# Patient Record
Sex: Female | Born: 1960 | Race: White | Hispanic: No | Marital: Married | Smoking: Never smoker
Health system: Southern US, Community
[De-identification: ages and names within clinical notes are randomized; demographics above are authoritative.]

## PROBLEM LIST (undated history)

## (undated) DIAGNOSIS — E785 Hyperlipidemia, unspecified: Secondary | ICD-10-CM

## (undated) DIAGNOSIS — K219 Gastro-esophageal reflux disease without esophagitis: Secondary | ICD-10-CM

## (undated) DIAGNOSIS — I10 Essential (primary) hypertension: Secondary | ICD-10-CM

## (undated) HISTORY — PX: NO PAST SURGERIES: SHX2092

## (undated) HISTORY — PX: CRYOABLATION: SHX1415

## (undated) HISTORY — PX: ABDOMINAL HYSTERECTOMY: SHX81

---

## 1999-03-17 ENCOUNTER — Other Ambulatory Visit: Admission: RE | Admit: 1999-03-17 | Discharge: 1999-03-17 | Payer: Self-pay | Admitting: Obstetrics and Gynecology

## 2000-04-20 ENCOUNTER — Other Ambulatory Visit: Admission: RE | Admit: 2000-04-20 | Discharge: 2000-04-20 | Payer: Self-pay | Admitting: Obstetrics and Gynecology

## 2001-09-19 ENCOUNTER — Other Ambulatory Visit: Admission: RE | Admit: 2001-09-19 | Discharge: 2001-09-19 | Payer: Self-pay | Admitting: Obstetrics and Gynecology

## 2002-07-19 ENCOUNTER — Ambulatory Visit (HOSPITAL_COMMUNITY): Admission: RE | Admit: 2002-07-19 | Discharge: 2002-07-19 | Payer: Self-pay | Admitting: Obstetrics and Gynecology

## 2003-01-28 ENCOUNTER — Other Ambulatory Visit: Admission: RE | Admit: 2003-01-28 | Discharge: 2003-01-28 | Payer: Self-pay | Admitting: Obstetrics and Gynecology

## 2005-10-29 ENCOUNTER — Other Ambulatory Visit: Admission: RE | Admit: 2005-10-29 | Discharge: 2005-10-29 | Payer: Self-pay | Admitting: Obstetrics and Gynecology

## 2011-09-24 ENCOUNTER — Other Ambulatory Visit: Payer: Self-pay | Admitting: Family Medicine

## 2011-09-27 ENCOUNTER — Other Ambulatory Visit (HOSPITAL_COMMUNITY): Payer: Self-pay | Admitting: *Deleted

## 2011-09-27 MED ORDER — SODIUM CHLORIDE 0.9 % IV SOLN: INTRAVENOUS | Status: DC

## 2011-09-28 ENCOUNTER — Encounter (HOSPITAL_COMMUNITY): Payer: Self-pay

## 2011-09-28 ENCOUNTER — Other Ambulatory Visit (HOSPITAL_COMMUNITY): Payer: Self-pay | Admitting: *Deleted

## 2011-09-28 ENCOUNTER — Encounter (HOSPITAL_COMMUNITY)
Admission: RE | Admit: 2011-09-28 | Discharge: 2011-09-28 | Disposition: A | Discharge status: Home / Self Care | Payer: 59 | Source: Ambulatory Visit | Attending: Family Medicine | Admitting: Family Medicine

## 2011-09-28 DIAGNOSIS — D509 Iron deficiency anemia, unspecified: Secondary | ICD-10-CM | POA: Insufficient documentation

## 2011-09-28 HISTORY — DX: Hyperlipidemia, unspecified: E78.5

## 2011-09-28 HISTORY — DX: Gastro-esophageal reflux disease without esophagitis: K21.9

## 2011-09-28 HISTORY — DX: Essential (primary) hypertension: I10

## 2011-09-28 MED ORDER — SODIUM CHLORIDE 0.9 % IV SOLN
INTRAVENOUS | Status: DC
  Administered 2011-09-28: 10:00:00 via INTRAVENOUS

## 2011-09-28 MED ORDER — SODIUM CHLORIDE 0.9 % IV SOLN
1000.0000 mg | Freq: Once | INTRAVENOUS | Status: AC
  Administered 2011-09-28: 1000 mg via INTRAVENOUS
  Filled 2011-09-28: qty 20

## 2011-09-28 MED ORDER — SODIUM CHLORIDE 0.9 % IV SOLN
25.0000 mg | Freq: Once | INTRAVENOUS | Status: AC
  Administered 2011-09-28: 25 mg via INTRAVENOUS
  Filled 2011-09-28: qty 0.5

## 2011-09-28 NOTE — Progress Notes (Signed)
Patient tolerated test dose well. Pharmacy aware and will send full dose.

## 2012-03-01 ENCOUNTER — Other Ambulatory Visit: Payer: Self-pay | Admitting: Dermatology

## 2012-03-23 ENCOUNTER — Telehealth: Payer: Self-pay | Admitting: Hematology and Oncology

## 2012-03-23 NOTE — Telephone Encounter (Signed)
lmonvm adviisng the pt to call me back about an appt

## 2012-03-24 ENCOUNTER — Telehealth: Payer: Self-pay | Admitting: Hematology and Oncology

## 2012-03-24 NOTE — Telephone Encounter (Signed)
S/w the pt's dtr regarding trying to set up the pt for a new pt appt with dr odogwu. Per pt's dtr she would need to discuss this with the rmother first and have her call us back

## 2012-03-27 ENCOUNTER — Telehealth: Payer: Self-pay | Admitting: Hematology and Oncology

## 2012-03-27 NOTE — Telephone Encounter (Signed)
Pt called me back to set up her new pt appt with dr Dalene Carrow

## 2012-03-27 NOTE — Telephone Encounter (Signed)
Referred by Dr. Robert Reade Dx-IDA °

## 2012-04-04 ENCOUNTER — Other Ambulatory Visit: Payer: Self-pay | Admitting: Dermatology

## 2012-04-06 ENCOUNTER — Ambulatory Visit (HOSPITAL_BASED_OUTPATIENT_CLINIC_OR_DEPARTMENT_OTHER): Payer: 59 | Admitting: Hematology and Oncology

## 2012-04-06 ENCOUNTER — Ambulatory Visit (HOSPITAL_BASED_OUTPATIENT_CLINIC_OR_DEPARTMENT_OTHER): Payer: 59

## 2012-04-06 ENCOUNTER — Encounter: Payer: Self-pay | Admitting: Hematology and Oncology

## 2012-04-06 ENCOUNTER — Ambulatory Visit (HOSPITAL_BASED_OUTPATIENT_CLINIC_OR_DEPARTMENT_OTHER): Payer: 59 | Admitting: Lab

## 2012-04-06 ENCOUNTER — Telehealth: Payer: Self-pay | Admitting: Hematology and Oncology

## 2012-04-06 VITALS — BP 146/92 | HR 83 | Temp 99.5°F | Ht 67.0 in | Wt 132.8 lb

## 2012-04-06 DIAGNOSIS — N920 Excessive and frequent menstruation with regular cycle: Secondary | ICD-10-CM

## 2012-04-06 DIAGNOSIS — D509 Iron deficiency anemia, unspecified: Secondary | ICD-10-CM

## 2012-04-06 DIAGNOSIS — D539 Nutritional anemia, unspecified: Secondary | ICD-10-CM

## 2012-04-06 DIAGNOSIS — R5381 Other malaise: Secondary | ICD-10-CM

## 2012-04-06 DIAGNOSIS — R58 Hemorrhage, not elsewhere classified: Secondary | ICD-10-CM

## 2012-04-06 DIAGNOSIS — E119 Type 2 diabetes mellitus without complications: Secondary | ICD-10-CM | POA: Insufficient documentation

## 2012-04-06 DIAGNOSIS — I10 Essential (primary) hypertension: Secondary | ICD-10-CM | POA: Insufficient documentation

## 2012-04-06 LAB — CBC & DIFF AND RETIC
BASO%: 0.9 % (ref 0.0–2.0)
Eosinophils Absolute: 0.2 10*3/uL (ref 0.0–0.5)
MONO#: 0.5 10*3/uL (ref 0.1–0.9)
NEUT#: 4.5 10*3/uL (ref 1.5–6.5)
RBC: 4.53 10*6/uL (ref 3.70–5.45)
RDW: 12.8 % (ref 11.2–14.5)
Retic %: 1.19 % (ref 0.70–2.10)
Retic Ct Abs: 53.91 10*3/uL (ref 33.70–90.70)
WBC: 6.7 10*3/uL (ref 3.9–10.3)

## 2012-04-06 LAB — MORPHOLOGY: PLT EST: INCREASED

## 2012-04-06 NOTE — Progress Notes (Signed)
New patient today, patient accompanied by her husband, patient has insurance patient stated that she was not in need of any assistance and did not think that she would need the assistance, she did take my contact information.

## 2012-04-06 NOTE — Progress Notes (Signed)
Dr.     Elias Else      -      Primary  @  [facility]. Dr.     Venancio Poisson      -      Dermatologist. Dr.     Richardean Chimera      -     GYN.  [facility]    On   [address].  Cell    Phone       [phone].

## 2012-04-06 NOTE — Progress Notes (Signed)
CC:   Robert A. Nicholos Johns, M.D. Shirley Friar, MD Juluis Mire, M.D.  IDENTIFYING STATEMENT:  The patient is a 51 year old woman seen at the request of Dr. Nicholos Johns with anemia.  HISTORY OF PRESENT ILLNESS:  The patient has a history of ongoing anemia despite being on oral iron for a year and a half.  She received IV iron in the form of dextran in November of 2012.  She noted after this improved energy levels.  She was not craving ice.  She also notes that she had alopecia and her hair had begun to grow back.  Unfortunately her symptoms have returned which she describes as a vengeance.  She is not able to focus.  She has fatigue and gets tired easily.  She does have a background history of heavy menses.  She describes periods lasting as long as a week with heavy flow with clots.  She does see Dr. Arelia Sneddon. She is known to have fibroids.  She indicates a hysterectomy after she has previously failed medications.  The patient admits that she has been reluctant.  She also states she received a colonoscopy at age 1 with Dr. Bosie Clos.  She remarks that this was unremarkable.  She has not had an endoscopy or small bowel exam.  She denies rectal bleeding, hematuria.  She does describe that she is prone to bruising easily.  She has had some intentional weight loss because she notes increasing nausea with decreased appetite.  She is also now beginning to crave ice. Reviewed CBC obtained at Dr. Benjaman Pott office and results are as follows.  03/08/2012 white cell count 7.1, hemoglobin 12.8, hematocrit 9.2, platelets 409.  TIBC 488.  Iron 55.  Ferritin 5.6.  10/08/2011 white cell count 8.2, hemoglobin 9.9, hematocrit 32.7, platelets 425. 09/03/2011 white cell count 6.3, hemoglobin 9.1, hematocrit 29, platelets 420.  PAST MEDICAL HISTORY: 1. Diabetes mellitus. 2. Hypertension. 3. Hyperlipidemia. 4. GERD. 5. Attention-deficit disorder.  ALLERGIES:  Accupril.  MEDICATIONS:  Losartan  hydrochlorothiazide 100/12.5 one tablet daily, Lipitor 40 mg daily, omeprazole 20 mg daily, atomoxetine 80 mg daily, Norvasc 5 mg daily, Poly Iron 150 mg b.i.d., Biotin 500 mcg daily, saxagliptin metformin 2.5/100 daily.  SOCIAL HISTORY:  The patient is married with 2 children.  Denies alcohol or tobacco use.  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  FAMILY HISTORY:  Negative for hematologic or oncologic malignancies. Negative for anemia, bruising or bleeding.  HEALTH MAINTENANCE:  Gets annual mammograms.  Last colonoscopy as noted above was at age 51.  REVIEW OF SYSTEMS:  Denies fever, chills, night sweats, anorexia.  Has undocumented weight loss.  Notes alopecia.  She is fatigued.  Denies pain.  GI:  Admits to nausea with anorexia.  Denies abdominal pain, diarrhea, melena, hematochezia.  She is not constipated.  GU:  Denies dysuria, hematuria, nocturia, frequency.  GYN:  Admits to menorrhagia. Skin:  Bruises easily.  Cardiovascular:  Denies chest pain, PND, orthopnea, ankle swelling.  Respirations:  Denies cough, hemoptysis, wheeze, shortness of breath.  Neurologic:  Denies headaches, vision change, extremity weakness.  Rest of the review of systems negative.  PHYSICAL EXAM:  The patient is a well-appearing, well-nourished woman in no distress.  Vitals:  Pulse 83, blood pressure 146/92, temperature 99.5, respirations 18, weight 132 pounds.  HEENT:  Head is atraumatic, normocephalic.  Sclerae anicteric.  Mouth moist.  Neck:  Supple.  Chest: Clear to percussion and auscultation.  Cardiovascular:  First and second heart sounds present.  No added  sounds or murmurs.  Abdomen:  Soft, nontender.  Bowel sounds present.  Extremities:  No calf tenderness. Pulses present and symmetrical.  CNS:  Nonfocal.  Lymph nodes:  No adenopathy.  IMPRESSION AND PLAN:  Mrs. Ashmore is a 51 year old woman with a history of severe iron-deficiency anemia which is likely secondary to menorrhagia. She has  received a colonoscopy 2 years ago.  I would like for her to touch base with Dr. Bosie Clos to see if an endoscopy and small bowel exam is required.  Document very low iron stores on labs that she had earlier this month.  In the past her symptoms seemed to have improved with IV iron.  She is a candidate for IV iron, but in the form of Feraheme.  This will be scheduled soon.  She also gives a history of menorrhagia with bruising.  Dr. Arelia Sneddon has suggested a hysterectomy.  I recommend that she return and discuss this further with him as this may decrease symptoms of anemia in the future. I also think it would be a good idea for her to get a von Willebrand panel.  In addition, she will return to lab to obtain a repeat CBC with anemia panel.  Will also check a vitamin B12 level as she does take metformin.  Will also rule out myelodyscrasia with a serum protein electrophoresis IFE.  Will review morphology.  Will rule out hemolysis with Coombs and haptoglobin.  I spent more than half the time discussing anemia potential treatments and plan.  The patient who is here with her husband had a number of questions which were all answered.  She follows up in 6 weeks time with blood work.  Will also be telephoning the patient with results as we receive them.    ______________________________ Laurice Record, M.D. LIO/MEDQ  D:  04/06/2012  T:  04/06/2012  Job:  [id number]

## 2012-04-06 NOTE — Patient Instructions (Signed)
Chelsea Espinoza  [id number]  [facility] Discharge Instructions  RECOMMENDATIONS MADE BY THE CONSULTANT AND ANY TEST RESULTS WILL BE SENT TO YOUR REFERRING DOCTOR.   EXAM FINDINGS BY MD TODAY AND SIGNS AND SYMPTOMS TO REPORT TO CLINIC OR PRIMARY MD:   Your current list of medications are: Current Outpatient Prescriptions  Medication Sig Dispense Refill  . atomoxetine (STRATTERA) 80 MG capsule Take 80 mg by mouth daily.        Marland Kitchen atorvastatin (LIPITOR) 40 MG tablet Take 40 mg by mouth daily.        . Linagliptin-Metformin HCl (JENTADUETO) 2.03-999 MG TABS Take 1 tablet by mouth 2 (two) times daily. Jentadueto 2.03/999 : 2.5mg /1000mg  1 tablet twice / day with meals       . omeprazole (PRILOSEC) 20 MG capsule Take 20 mg by mouth daily.           INSTRUCTIONS GIVEN AND DISCUSSED:   SPECIAL INSTRUCTIONS/FOLLOW-UP:  See above.  I acknowledge that I have been informed and understand all the instructions given to me and received a copy. I do not have any more questions at this time, but understand that I may call the [facility] at [phone] during business hours should I have any further questions or need assistance in obtaining follow-up care.

## 2012-04-06 NOTE — Progress Notes (Signed)
This office note has been dictated.

## 2012-04-06 NOTE — Telephone Encounter (Signed)
apts made and printed for pt aom °

## 2012-04-07 ENCOUNTER — Telehealth: Payer: Self-pay | Admitting: Hematology and Oncology

## 2012-04-07 ENCOUNTER — Ambulatory Visit (HOSPITAL_BASED_OUTPATIENT_CLINIC_OR_DEPARTMENT_OTHER): Payer: 59

## 2012-04-07 VITALS — BP 132/82 | HR 78 | Temp 97.6°F

## 2012-04-07 DIAGNOSIS — D509 Iron deficiency anemia, unspecified: Secondary | ICD-10-CM

## 2012-04-07 DIAGNOSIS — D539 Nutritional anemia, unspecified: Secondary | ICD-10-CM

## 2012-04-07 MED ORDER — SODIUM CHLORIDE 0.9 % IV SOLN
1020.0000 mg | Freq: Once | INTRAVENOUS | Status: AC
  Administered 2012-04-07: 1020 mg via INTRAVENOUS
  Filled 2012-04-07: qty 34

## 2012-04-07 MED ORDER — SODIUM CHLORIDE 0.9 % IV SOLN
Freq: Once | INTRAVENOUS | Status: AC
  Administered 2012-04-07: 12:00:00 via INTRAVENOUS

## 2012-04-07 NOTE — Telephone Encounter (Signed)
5/30 pt was to see gi drschooler on 5/30 at 2:30

## 2012-04-07 NOTE — Patient Instructions (Addendum)
Ferumoxytol injection What is this medicine? FERUMOXYTOL is an iron complex. Iron is used to make healthy red blood cells, which carry oxygen and nutrients throughout the body. This medicine is used to treat iron deficiency anemia in people with chronic kidney disease. This medicine may be used for other purposes; ask your health care provider or pharmacist if you have questions. What should I tell my health care provider before I take this medicine? They need to know if you have any of these conditions: -anemia not caused by low iron levels -high levels of iron in the blood -magnetic resonance imaging (MRI) test scheduled -an unusual or allergic reaction to iron, other medicines, foods, dyes, or preservatives -pregnant or trying to get pregnant -breast-feeding How should I use this medicine? This medicine is for infusion into a vein. It is given by a health care professional in a hospital or clinic setting. Talk to your pediatrician regarding the use of this medicine in children. Special care may be needed. Overdosage: If you think you've taken too much of this medicine contact a poison control center or emergency room at once. Overdosage: If you think you have taken too much of this medicine contact a poison control center or emergency room at once. NOTE: This medicine is only for you. Do not share this medicine with others. What if I miss a dose? It is important not to miss your dose. Call your doctor or health care professional if you are unable to keep an appointment. What may interact with this medicine? This medicine may interact with the following medications: -other iron products This list may not describe all possible interactions. Give your health care provider a list of all the medicines, herbs, non-prescription drugs, or dietary supplements you use. Also tell them if you smoke, drink alcohol, or use illegal drugs. Some items may interact with your medicine. What should I watch  for while using this medicine? Visit your doctor or healthcare professional regularly. Tell your doctor or healthcare professional if your symptoms do not start to get better or if they get worse. You may need blood work done while you are taking this medicine. You may need to follow a special diet. Talk to your doctor. Foods that contain iron include: whole grains/cereals, dried fruits, beans, or peas, leafy green vegetables, and organ meats (liver, kidney). What side effects may I notice from receiving this medicine? Side effects that you should report to your doctor or health care professional as soon as possible: -allergic reactions like skin rash, itching or hives, swelling of the face, lips, or tongue -breathing problems -changes in blood pressure -feeling faint or lightheaded, falls -fever or chills -flushing, sweating, or hot feelings -swelling of the ankles or feet Side effects that usually do not require medical attention (Report these to your doctor or health care professional if they continue or are bothersome.): -diarrhea -headache -nausea, vomiting -stomach pain This list may not describe all possible side effects. Call your doctor for medical advice about side effects. You may report side effects to FDA at 1-800-FDA-1088. Where should I keep my medicine? This drug is given in a hospital or clinic and will not be stored at home. NOTE: This sheet is a summary. It may not cover all possible information. If you have questions about this medicine, talk to your doctor, pharmacist, or health care provider.  2012, Elsevier/Gold Standard. (07/17/2008 9:48:25 PM) 

## 2012-04-07 NOTE — Progress Notes (Signed)
1240- Feraheme infusion complete.  Pt tolerated infusion well, no complaints-dhp, rn

## 2012-04-10 ENCOUNTER — Telehealth: Payer: Self-pay | Admitting: *Deleted

## 2012-04-10 LAB — SPEP & IFE WITH QIG
Alpha-2-Globulin: 9.8 % (ref 7.1–11.8)
IgA: 139 mg/dL (ref 69–380)
IgG (Immunoglobin G), Serum: 1020 mg/dL (ref 690–1700)
IgM, Serum: 145 mg/dL (ref 52–322)
Total Protein, Serum Electrophoresis: 7.2 g/dL (ref 6.0–8.3)

## 2012-04-10 LAB — COMPREHENSIVE METABOLIC PANEL
ALT: 11 U/L (ref 0–35)
BUN: 15 mg/dL (ref 6–23)
CO2: 25 mEq/L (ref 19–32)
Calcium: 10.1 mg/dL (ref 8.4–10.5)
Chloride: 103 mEq/L (ref 96–112)
Creatinine, Ser: 0.95 mg/dL (ref 0.50–1.10)

## 2012-04-10 LAB — LACTATE DEHYDROGENASE: LDH: 134 U/L (ref 94–250)

## 2012-04-10 LAB — VITAMIN B12: Vitamin B-12: 439 pg/mL (ref 211–911)

## 2012-04-10 LAB — IRON AND TIBC: UIBC: 439 ug/dL — ABNORMAL HIGH (ref 125–400)

## 2012-04-10 LAB — DIRECT ANTIGLOBULIN TEST (NOT AT ARMC): DAT IgG: NEGATIVE

## 2012-04-10 NOTE — Telephone Encounter (Signed)
Spoke with pt on cell phone and informed pt re: Ferritin level 4  As per md.    Asked how pt felt today post Feraheme infusion on 04/07/12.   Pt stated she was feeling fine ,  Not feeling any difference yet.   Pt understood that it would take at least a month to see the results of iron infusion.    Confirmed date and time for appts lab and f/u visit.   Pt voiced understanding.

## 2012-04-11 ENCOUNTER — Telehealth: Payer: Self-pay | Admitting: *Deleted

## 2012-04-11 NOTE — Telephone Encounter (Signed)
Spoke with pt and informed pt re:  VWB ok as per md.   Pt voiced understanding.

## 2012-04-12 ENCOUNTER — Ambulatory Visit: Payer: 59 | Admitting: Hematology and Oncology

## 2012-05-03 ENCOUNTER — Other Ambulatory Visit: Payer: Self-pay | Admitting: Gastroenterology

## 2012-05-03 ENCOUNTER — Other Ambulatory Visit (HOSPITAL_COMMUNITY)
Admission: RE | Admit: 2012-05-03 | Discharge: 2012-05-03 | Disposition: A | Discharge status: Home / Self Care | Payer: 59 | Source: Ambulatory Visit | Attending: Gastroenterology | Admitting: Gastroenterology

## 2012-05-03 DIAGNOSIS — D509 Iron deficiency anemia, unspecified: Secondary | ICD-10-CM | POA: Insufficient documentation

## 2012-05-05 ENCOUNTER — Other Ambulatory Visit (HOSPITAL_BASED_OUTPATIENT_CLINIC_OR_DEPARTMENT_OTHER): Payer: 59

## 2012-05-05 DIAGNOSIS — R58 Hemorrhage, not elsewhere classified: Secondary | ICD-10-CM

## 2012-05-05 DIAGNOSIS — D539 Nutritional anemia, unspecified: Secondary | ICD-10-CM

## 2012-05-05 DIAGNOSIS — N920 Excessive and frequent menstruation with regular cycle: Secondary | ICD-10-CM

## 2012-05-05 LAB — BASIC METABOLIC PANEL
BUN: 7 mg/dL (ref 6–23)
CO2: 20 mEq/L (ref 19–32)
Chloride: 103 mEq/L (ref 96–112)
Creatinine, Ser: 0.84 mg/dL (ref 0.50–1.10)
Glucose, Bld: 217 mg/dL — ABNORMAL HIGH (ref 70–99)
Potassium: 3.7 mEq/L (ref 3.5–5.3)

## 2012-05-05 LAB — CBC WITH DIFFERENTIAL/PLATELET
Basophils Absolute: 0 10*3/uL (ref 0.0–0.1)
EOS%: 2.9 % (ref 0.0–7.0)
Eosinophils Absolute: 0.2 10*3/uL (ref 0.0–0.5)
HCT: 37.7 % (ref 34.8–46.6)
HGB: 12.3 g/dL (ref 11.6–15.9)
MCH: 29 pg (ref 25.1–34.0)
NEUT#: 3.7 10*3/uL (ref 1.5–6.5)
NEUT%: 71.3 % (ref 38.4–76.8)
RDW: 15.8 % — ABNORMAL HIGH (ref 11.2–14.5)
lymph#: 0.9 10*3/uL (ref 0.9–3.3)

## 2012-05-05 LAB — IRON AND TIBC
%SAT: 24 % (ref 20–55)
Iron: 70 ug/dL (ref 42–145)

## 2012-05-05 LAB — FOLATE: Folate: 18.6 ng/mL

## 2012-05-05 LAB — FERRITIN: Ferritin: 106 ng/mL (ref 10–291)

## 2012-05-08 ENCOUNTER — Telehealth: Payer: Self-pay | Admitting: *Deleted

## 2012-05-08 NOTE — Telephone Encounter (Signed)
Received call from pt stating re: pt had EGD and colonoscopy done last Wed 05/03/12 by Dr. Bosie Clos.  Pt was informed by Dr. Bosie Clos that biopsy results might not be back in time for pt's appt with Dr. Dalene Carrow on [facility] 05/10/12.   Pt just wanted to let Dr. Dalene Carrow know.   Pt stated she would keep her f/u appt as scheduled unless md wanted to wait for biopsy reports. Pt's   Phone    [phone].

## 2012-05-09 ENCOUNTER — Telehealth: Payer: Self-pay | Admitting: *Deleted

## 2012-05-09 NOTE — Telephone Encounter (Signed)
Spoke with pt today.  Informed pt that Dr. Dalene Carrow does not need biopsy results since pt is evaluated here for anemia issue.  Pt voiced understanding and stated she would be coming for her appt on 05/10/12.

## 2012-05-10 ENCOUNTER — Telehealth: Payer: Self-pay | Admitting: Hematology and Oncology

## 2012-05-10 ENCOUNTER — Encounter: Payer: Self-pay | Admitting: Hematology and Oncology

## 2012-05-10 ENCOUNTER — Ambulatory Visit (HOSPITAL_BASED_OUTPATIENT_CLINIC_OR_DEPARTMENT_OTHER): Payer: 59 | Admitting: Hematology and Oncology

## 2012-05-10 ENCOUNTER — Telehealth: Payer: Self-pay | Admitting: *Deleted

## 2012-05-10 VITALS — BP 140/82 | HR 94 | Temp 98.9°F | Ht 67.0 in | Wt 132.7 lb

## 2012-05-10 DIAGNOSIS — D509 Iron deficiency anemia, unspecified: Secondary | ICD-10-CM

## 2012-05-10 DIAGNOSIS — D539 Nutritional anemia, unspecified: Secondary | ICD-10-CM

## 2012-05-10 DIAGNOSIS — N920 Excessive and frequent menstruation with regular cycle: Secondary | ICD-10-CM

## 2012-05-10 NOTE — Telephone Encounter (Signed)
appts made and printed for pt aom °

## 2012-05-10 NOTE — Progress Notes (Signed)
This office note has been dictated.

## 2012-05-10 NOTE — Telephone Encounter (Signed)
Per staff message I have scheduled appts. JMW  

## 2012-05-10 NOTE — Progress Notes (Signed)
CC:   Robert A. Nicholos Johns, M.D. Shirley Friar, MD Juluis Mire, M.D.  IDENTIFYING STATEMENT:  The patient is a 51 year old woman with anemia who presents for followup.  INTERVAL HISTORY:  In summary, the patient has a long history for menorrhagia.  Despite oral iron, has not been able to maintain iron stores.  She was referred to Hematology.  Ferritin levels drawn on 05/30 were 4.  Iron was 36, TIBC 475.  B12 439.  LDH was normal at 134, haptoglobin normal at 110.  Serum protein electrophoresis did not reveal an M spike, and immunoglobulin levels were unremarkable.  Von Willebrand's antigen and activity levels were unremarkable.  Direct Coombs was negative.  The patient received IV iron in the form of Feraheme on 05/30.  Does not note improvement in energy levels.  Has since seen Dr. Bosie Clos and has undergone a GI evaluation.  She has also had labs repeated, and they are below.  MEDICATIONS:  Reviewed and updated.  PAST MEDICAL HISTORY/FAMILY HISTORY/SOCIAL HISTORY:  Unchanged.  REVIEW OF SYSTEMS:  Besides generalized lack of energy and irregular heavy menses, rest of review of systems negative.  PHYSICAL EXAM:  Patient is alert and oriented x3.  Vitals:  Pulse 94, blood pressure 140/82, temperature 98.9, respirations 20, weight 132 pounds.  Sclerae anicteric.  Mouth moist.  Chest:  Clear.  Abdomen: Soft.  Extremities:  No edema.  LAB DATA:  05/05/2012 white cell count 5.1, hemoglobin 12.3, hematocrit 37.7, platelets 365.  Peripheral smear unremarkable.  Iron 70 (36), TIBC 286, saturation 24% (8%), ferritin 106 (4).  Sodium 138, potassium 3.7, chloride 103, CO2 20, BUN 7, creatinine 0.84, glucose 217, calcium 9.3.  IMPRESSION AND PLAN:  Mrs. Meenan is a 51 year old woman with a history of severe iron deficiency anemia likely secondary to menorrhagia.  I continue to recommend that she touch base with Dr. Arelia Sneddon as her anemia may never improve if she continues to have heavy  periods.  She concurs and will hopefully make an appointment soon.  Her ferritin stores are currently adequate, but I will have her return in a month's time.  Will repeat labs then.  If they continue to decline, will offer her Feraheme.    ______________________________ Laurice Record, M.D. LIO/MEDQ  D:  05/10/2012  T:  05/10/2012  Job:  [id number]

## 2012-05-10 NOTE — Patient Instructions (Signed)
Chelsea Espinoza  [id number]  [facility] Discharge Instructions  RECOMMENDATIONS MADE BY THE CONSULTANT AND ANY TEST RESULTS WILL BE SENT TO YOUR REFERRING DOCTOR.   EXAM FINDINGS BY MD TODAY AND SIGNS AND SYMPTOMS TO REPORT TO CLINIC OR PRIMARY MD:   Your current list of medications are: Current Outpatient Prescriptions  Medication Sig Dispense Refill  . amLODipine (NORVASC) 5 MG tablet Take 5 mg by mouth daily.      Marland Kitchen atomoxetine (STRATTERA) 80 MG capsule Take 80 mg by mouth daily.        Marland Kitchen atorvastatin (LIPITOR) 40 MG tablet Take 40 mg by mouth daily.        . Biotin 5000 MCG CAPS Take 1 capsule by mouth daily.      Marland Kitchen losartan-hydrochlorothiazide (HYZAAR) 100-12.5 MG per tablet Take 1 tablet by mouth daily.      Marland Kitchen omeprazole (PRILOSEC) 20 MG capsule Take 20 mg by mouth daily.        . Polysaccharide Iron Complex (POLY-IRON 150 PO) Take 1 tablet by mouth daily.       . Saxagliptin-Metformin 2.03-999 MG TB24 Take 2 tablets by mouth daily.         INSTRUCTIONS GIVEN AND DISCUSSED:   SPECIAL INSTRUCTIONS/FOLLOW-UP:  See above.  I acknowledge that I have been informed and understand all the instructions given to me and received a copy. I do not have any more questions at this time, but understand that I may call the [facility] at [phone] during business hours should I have any further questions or need assistance in obtaining follow-up care.

## 2012-05-12 ENCOUNTER — Other Ambulatory Visit: Payer: Self-pay | Admitting: Hematology and Oncology

## 2012-05-15 ENCOUNTER — Telehealth: Payer: Self-pay | Admitting: Hematology and Oncology

## 2012-05-15 NOTE — Telephone Encounter (Signed)
called pt with 8/1 iron tx   aom

## 2012-05-25 ENCOUNTER — Telehealth: Payer: Self-pay | Admitting: Hematology and Oncology

## 2012-05-25 NOTE — Telephone Encounter (Signed)
On 7/3 when pt was here I was able to get her in same day with Dr Chelsea Espinoza as she is estab there.

## 2012-06-06 ENCOUNTER — Other Ambulatory Visit (HOSPITAL_BASED_OUTPATIENT_CLINIC_OR_DEPARTMENT_OTHER): Payer: 59 | Admitting: Lab

## 2012-06-06 DIAGNOSIS — D509 Iron deficiency anemia, unspecified: Secondary | ICD-10-CM

## 2012-06-06 DIAGNOSIS — D539 Nutritional anemia, unspecified: Secondary | ICD-10-CM

## 2012-06-06 LAB — IRON AND TIBC
Iron: 140 ug/dL (ref 42–145)
TIBC: 352 ug/dL (ref 250–470)
UIBC: 212 ug/dL (ref 125–400)

## 2012-06-06 LAB — FERRITIN: Ferritin: 31 ng/mL (ref 10–291)

## 2012-06-06 LAB — CBC WITH DIFFERENTIAL/PLATELET
BASO%: 0.8 % (ref 0.0–2.0)
EOS%: 4.1 % (ref 0.0–7.0)
HCT: 43.2 % (ref 34.8–46.6)
MCH: 30.5 pg (ref 25.1–34.0)
MCHC: 33.4 g/dL (ref 31.5–36.0)
MONO#: 0.4 10*3/uL (ref 0.1–0.9)
RBC: 4.73 10*6/uL (ref 3.70–5.45)
RDW: 16.6 % — ABNORMAL HIGH (ref 11.2–14.5)
WBC: 5.2 10*3/uL (ref 3.9–10.3)
lymph#: 0.9 10*3/uL (ref 0.9–3.3)

## 2012-06-06 LAB — BASIC METABOLIC PANEL
CO2: 24 mEq/L (ref 19–32)
Chloride: 100 mEq/L (ref 96–112)
Potassium: 4 mEq/L (ref 3.5–5.3)
Sodium: 139 mEq/L (ref 135–145)

## 2012-06-08 ENCOUNTER — Telehealth: Payer: Self-pay | Admitting: Nurse Practitioner

## 2012-06-08 ENCOUNTER — Other Ambulatory Visit: Payer: Self-pay | Admitting: Family

## 2012-06-08 ENCOUNTER — Ambulatory Visit (HOSPITAL_BASED_OUTPATIENT_CLINIC_OR_DEPARTMENT_OTHER): Payer: 59

## 2012-06-08 ENCOUNTER — Other Ambulatory Visit: Payer: Self-pay | Admitting: Hematology and Oncology

## 2012-06-08 VITALS — BP 123/81 | HR 89 | Temp 98.0°F

## 2012-06-08 DIAGNOSIS — D539 Nutritional anemia, unspecified: Secondary | ICD-10-CM

## 2012-06-08 DIAGNOSIS — D509 Iron deficiency anemia, unspecified: Secondary | ICD-10-CM

## 2012-06-08 MED ORDER — SODIUM CHLORIDE 0.9 % IV SOLN: Freq: Once | INTRAVENOUS | Status: DC

## 2012-06-08 MED ORDER — SODIUM CHLORIDE 0.9 % IJ SOLN
3.0000 mL | Freq: Once | INTRAMUSCULAR | Status: DC | PRN
  Filled 2012-06-08: qty 10

## 2012-06-08 MED ORDER — SODIUM CHLORIDE 0.9 % IV SOLN
1020.0000 mg | Freq: Once | INTRAVENOUS | Status: AC
  Administered 2012-06-08: 1020 mg via INTRAVENOUS
  Filled 2012-06-08: qty 34

## 2012-06-08 NOTE — Telephone Encounter (Signed)
Spoke with patient- instructed per Dr. Dalene Carrow to come in for [facility] infusion this afternoon.  Pt also reported she had seen Dr. Arelia Sneddon and he had prescribed her Lupron to stop her periods.

## 2012-06-12 ENCOUNTER — Telehealth: Payer: Self-pay | Admitting: Hematology and Oncology

## 2012-06-12 NOTE — Telephone Encounter (Signed)
S/w pt re appts for 11/20 and 11/27. Per 8/1 pof appts should be in November. Pt instructed to disregard schedule she was given for September.

## 2012-07-28 ENCOUNTER — Other Ambulatory Visit: Payer: 59 | Admitting: Lab

## 2012-08-04 ENCOUNTER — Ambulatory Visit: Payer: 59 | Admitting: Hematology and Oncology

## 2012-08-04 ENCOUNTER — Ambulatory Visit: Payer: 59

## 2012-09-19 ENCOUNTER — Other Ambulatory Visit: Payer: Self-pay

## 2012-09-20 ENCOUNTER — Telehealth: Payer: Self-pay | Admitting: Hematology and Oncology

## 2012-09-20 NOTE — Telephone Encounter (Signed)
Moved f/u and iron inf from 11/27 to 11/29 - poof. S/w pt today re change w/new d/t. Also confirmed 11/20 lb appt

## 2012-09-27 ENCOUNTER — Other Ambulatory Visit (HOSPITAL_BASED_OUTPATIENT_CLINIC_OR_DEPARTMENT_OTHER): Payer: 59 | Admitting: Lab

## 2012-09-27 DIAGNOSIS — D539 Nutritional anemia, unspecified: Secondary | ICD-10-CM

## 2012-09-27 LAB — CBC WITH DIFFERENTIAL/PLATELET
Basophils Absolute: 0 10*3/uL (ref 0.0–0.1)
EOS%: 3.8 % (ref 0.0–7.0)
Eosinophils Absolute: 0.2 10*3/uL (ref 0.0–0.5)
HCT: 42.8 % (ref 34.8–46.6)
HGB: 14.5 g/dL (ref 11.6–15.9)
LYMPH%: 21.6 % (ref 14.0–49.7)
MCH: 30.3 pg (ref 25.1–34.0)
MCV: 89.5 fL (ref 79.5–101.0)
MONO%: 7.5 % (ref 0.0–14.0)
NEUT#: 3.4 10*3/uL (ref 1.5–6.5)
NEUT%: 66.3 % (ref 38.4–76.8)
Platelets: 361 10*3/uL (ref 145–400)
RDW: 12.2 % (ref 11.2–14.5)

## 2012-09-29 LAB — FERRITIN: Ferritin: 136 ng/mL (ref 10–291)

## 2012-09-29 LAB — IRON AND TIBC: %SAT: 32 % (ref 20–55)

## 2012-10-04 ENCOUNTER — Ambulatory Visit: Payer: 59 | Admitting: Hematology and Oncology

## 2012-10-04 ENCOUNTER — Ambulatory Visit: Payer: 59

## 2012-10-06 ENCOUNTER — Ambulatory Visit: Payer: 59

## 2012-10-06 ENCOUNTER — Telehealth: Payer: Self-pay | Admitting: *Deleted

## 2012-10-06 ENCOUNTER — Telehealth: Payer: Self-pay | Admitting: Hematology and Oncology

## 2012-10-06 ENCOUNTER — Encounter: Payer: Self-pay | Admitting: Hematology and Oncology

## 2012-10-06 ENCOUNTER — Ambulatory Visit (HOSPITAL_BASED_OUTPATIENT_CLINIC_OR_DEPARTMENT_OTHER): Payer: 59 | Admitting: Hematology and Oncology

## 2012-10-06 VITALS — BP 153/88 | HR 83 | Temp 97.2°F | Resp 18 | Ht 67.0 in | Wt 129.2 lb

## 2012-10-06 DIAGNOSIS — D219 Benign neoplasm of connective and other soft tissue, unspecified: Secondary | ICD-10-CM

## 2012-10-06 DIAGNOSIS — D509 Iron deficiency anemia, unspecified: Secondary | ICD-10-CM

## 2012-10-06 DIAGNOSIS — N920 Excessive and frequent menstruation with regular cycle: Secondary | ICD-10-CM

## 2012-10-06 NOTE — Progress Notes (Signed)
CC:   Robert A. Nicholos Johns, M.D. Juluis Mire, M.D.  IDENTIFYING STATEMENT:  The patient is a 51 year old woman with anemia who presents for followup.  INTERVAL HISTORY:  Mrs. Mcauliff was last seen in July.  She was initially diagnosed with anemia.  She was found to have low iron stores as a result of menorrhagia.  She has received 2 iron treatments in the form of Feraheme on 04/07/2012 and 06/08/2012.  She also reports that she has uterine fibroids and received Lupron shots.  She continues to have periods, albeit not as heavy as in the past.  She is also on oral prescription iron.  Otherwise feels well.  Energy level is fair.  She is able to perform all activities of daily living.  On 09/27/2012 white cell count 5.1, hemoglobin 14.5, hematocrit 42.8, platelets 361, iron 101, TIBC 320, saturation 32%, ferritin 136, folate 22.  MEDICATIONS:  NuIron 1 daily.  Rest of medicines reviewed and updated.  PHYSICAL EXAMINATION:  General:  The patient is alert and oriented times 3.  Vitals:  Pulse 83, blood pressure 153/88, temperature 97.2, respirations 18, weight 129.2 pounds.  HEENT:  Head is atraumatic, normocephalic.  Sclerae anicteric.  Mouth moist.  Chest/CVS: Unremarkable.  Abdomen:  Soft.  No masses.  Bowel sounds present. Extremities:  No edema.  LABORATORY DATA:  As above.  IMPRESSION AND PLAN:  Mrs. Worm is a 51 year old woman with a history of iron deficiency anemia which is likely felt to be secondary to menorrhagia.  Her current iron stores are adequate.  So with this said she will not be receiving Feraheme with this visit.  She follows up in 4 months' time for continuing assessment.    ______________________________ Laurice Record, M.D. LIO/MEDQ  D:  10/06/2012  T:  10/06/2012  Job:  [id number]

## 2012-10-06 NOTE — Telephone Encounter (Signed)
appts made and printed for pt aom °

## 2012-10-06 NOTE — Patient Instructions (Addendum)
Chelsea Espinoza  [id number]   [facility] - AFTER VISIT SUMMARY   **RECOMMENDATIONS MADE BY THE CONSULTANT AND ANY TEST    RESULTS WILL BE SENT TO YOUR REFERRING DOCTORS.   YOUR EXAM FINDINGS, LABS AND RESULTS WERE DISCUSSED BY YOUR MD TODAY.  YOU CAN GO TO THE [place] WEB SITE FOR INSTRUCTIONS ON HOW TO ASSESS MY CHART FOR ADDITIONAL INFORMATION AS NEEDED.  Your Updated drug allergies are: Allergies as of 10/06/2012 - Review Complete 10/06/2012  Allergen Reaction Noted  . Accupril (quinapril hcl)  04/06/2012    Your current list of medications are: Current Outpatient Prescriptions  Medication Sig Dispense Refill  . amLODipine (NORVASC) 5 MG tablet Take 5 mg by mouth daily.      Marland Kitchen atomoxetine (STRATTERA) 80 MG capsule Take 80 mg by mouth daily.        Marland Kitchen atorvastatin (LIPITOR) 40 MG tablet Take 40 mg by mouth daily.        . Biotin 5000 MCG CAPS Take 1 capsule by mouth daily.      Marland Kitchen losartan-hydrochlorothiazide (HYZAAR) 100-12.5 MG per tablet Take 1 tablet by mouth daily.      Marland Kitchen omeprazole (PRILOSEC) 20 MG capsule Take 20 mg by mouth daily.        . Polysaccharide Iron Complex (POLY-IRON 150 PO) Take 1 tablet by mouth daily.       . Saxagliptin-Metformin 2.03-999 MG TB24 Take 2 tablets by mouth daily.         INSTRUCTIONS GIVEN AND DISCUSSED:  See attached schedule   SPECIAL INSTRUCTIONS/FOLLOW-UP:  See above.  I acknowledge that I have been informed and understand all the instructions given to me and received a copy.I know to contact the clinic, my physician, or go to the emergency Department if any problems should occur.   I do not have any more questions at this time, but understand that I may call the [facility] at [phone] during business hours should I have any further questions or need assistance in obtaining follow-up care.

## 2012-10-06 NOTE — Telephone Encounter (Signed)
Faxed lab results done 09/27/12 to Dr. Shela Commons. McComb's office as per md's instructions.

## 2012-10-06 NOTE — Progress Notes (Signed)
This office note has been dictated.

## 2012-10-06 NOTE — Telephone Encounter (Signed)
Per staff phone call and POF I have scheduled appt. JMW  

## 2012-10-30 ENCOUNTER — Other Ambulatory Visit: Payer: 59

## 2012-11-06 ENCOUNTER — Ambulatory Visit: Payer: 59 | Admitting: Oncology

## 2012-12-09 ENCOUNTER — Telehealth: Payer: Self-pay | Admitting: *Deleted

## 2012-12-09 NOTE — Telephone Encounter (Signed)
Per patient reassignment, I have attempted to contact the patient regarding her new appts. I left the patient a message to contact me day 2/1 until 12pm at [phone] or on Monday at [phone]. Appts with lab/MD canceled. JMW

## 2012-12-09 NOTE — Telephone Encounter (Signed)
Another entry----Appts to be made with Dr. Arbutus Ped.

## 2012-12-11 ENCOUNTER — Telehealth: Payer: Self-pay | Admitting: *Deleted

## 2012-12-11 ENCOUNTER — Encounter: Payer: Self-pay | Admitting: *Deleted

## 2012-12-11 NOTE — Telephone Encounter (Signed)
Per patient reassignment, patient transferred from Dr. Dalene Carrow to Dr.Mohamed. I have explained to the patient that Dr. Dalene Carrow is no longer with the practice. New appts given. Letter mailed.  JMW

## 2012-12-11 NOTE — Telephone Encounter (Signed)
Patient called and requested that her lab appt be couple days before MD visit. Appt moved. JMW

## 2012-12-14 ENCOUNTER — Encounter: Payer: Self-pay | Admitting: *Deleted

## 2013-01-11 ENCOUNTER — Telehealth: Payer: Self-pay | Admitting: *Deleted

## 2013-01-11 NOTE — Telephone Encounter (Signed)
Patient called and moved her lab appt to later in the day tomorrow. JMW

## 2013-01-12 ENCOUNTER — Other Ambulatory Visit (HOSPITAL_BASED_OUTPATIENT_CLINIC_OR_DEPARTMENT_OTHER): Payer: 59

## 2013-01-12 DIAGNOSIS — D219 Benign neoplasm of connective and other soft tissue, unspecified: Secondary | ICD-10-CM

## 2013-01-12 DIAGNOSIS — D509 Iron deficiency anemia, unspecified: Secondary | ICD-10-CM

## 2013-01-12 LAB — BASIC METABOLIC PANEL (CC13)
CO2: 24 mEq/L (ref 22–29)
Chloride: 104 mEq/L (ref 98–107)
Glucose: 250 mg/dl — ABNORMAL HIGH (ref 70–99)
Sodium: 139 mEq/L (ref 136–145)

## 2013-01-12 LAB — CBC WITH DIFFERENTIAL/PLATELET
Basophils Absolute: 0 10*3/uL (ref 0.0–0.1)
EOS%: 3.2 % (ref 0.0–7.0)
Eosinophils Absolute: 0.2 10*3/uL (ref 0.0–0.5)
HGB: 14.5 g/dL (ref 11.6–15.9)
LYMPH%: 19.2 % (ref 14.0–49.7)
MCH: 30.7 pg (ref 25.1–34.0)
MCV: 90.2 fL (ref 79.5–101.0)
MONO%: 7.4 % (ref 0.0–14.0)
NEUT#: 4.3 10*3/uL (ref 1.5–6.5)
Platelets: 339 10*3/uL (ref 145–400)
RBC: 4.73 10*6/uL (ref 3.70–5.45)

## 2013-01-15 LAB — FERRITIN: Ferritin: 81 ng/mL (ref 10–291)

## 2013-01-16 ENCOUNTER — Ambulatory Visit (HOSPITAL_BASED_OUTPATIENT_CLINIC_OR_DEPARTMENT_OTHER): Payer: 59 | Admitting: Internal Medicine

## 2013-01-16 ENCOUNTER — Ambulatory Visit: Payer: 59

## 2013-01-16 ENCOUNTER — Telehealth: Payer: Self-pay | Admitting: Internal Medicine

## 2013-01-16 ENCOUNTER — Other Ambulatory Visit: Payer: 59

## 2013-01-16 ENCOUNTER — Encounter: Payer: Self-pay | Admitting: Internal Medicine

## 2013-01-16 ENCOUNTER — Other Ambulatory Visit: Payer: 59 | Admitting: Lab

## 2013-01-16 VITALS — BP 126/87 | HR 107 | Temp 98.9°F | Resp 18 | Ht 67.0 in | Wt 127.0 lb

## 2013-01-16 DIAGNOSIS — N920 Excessive and frequent menstruation with regular cycle: Secondary | ICD-10-CM

## 2013-01-16 DIAGNOSIS — D539 Nutritional anemia, unspecified: Secondary | ICD-10-CM

## 2013-01-16 DIAGNOSIS — D509 Iron deficiency anemia, unspecified: Secondary | ICD-10-CM

## 2013-01-16 NOTE — Progress Notes (Signed)
[facility] Telephone:([phone]   Fax:([phone]  OFFICE PROGRESS NOTE  [name], MD [facility] [address] [place] [address]  DIAGNOSIS: Iron deficiency anemia secondary to menorrhagia  PRIOR THERAPY: Feraheme infusion on as-needed basis last dose was given on 06/08/2012.  CURRENT THERAPY: Poly-iron 150 mg by mouth daily.  INTERVAL HISTORY: Chelsea Espinoza 52 y.o. female returns to the clinic today for followup visit accompanied her husband. She is a former patient of Dr. Dalene Carrow who is here today to establish care with me after Dr. Dalene Carrow left the practice. The patient is feeling fine today with no specific complaints. She is currently under treatment for iron deficiency anemia secondary to menorrhagia secondary to fibroid tumor. He appeared lost around 7 days with 2 heavy days with clots. She was treated with Lupron with no improvement. Her last treatment with Duncan Dull was in August of 2013. The patient denied having any significant fatigue or weakness. She denied having any significant chest pain, shortness of breath, cough or hemoptysis. Her iron study performed on 01/12/2013 showed normal iron of 123, total iron binding capacity 373, iron saturation 33% and ferritin level of 81.  MEDICAL HISTORY: Past Medical History  Diagnosis Date  . Diabetes mellitus     type 2  . Hypertension   . Hyperlipidemia   . GERD (gastroesophageal reflux disease)     ALLERGIES:  is allergic to accupril.  MEDICATIONS:  Current Outpatient Prescriptions  Medication Sig Dispense Refill  . amLODipine (NORVASC) 5 MG tablet Take 5 mg by mouth daily.      Marland Kitchen atomoxetine (STRATTERA) 80 MG capsule Take 80 mg by mouth daily.        Marland Kitchen atorvastatin (LIPITOR) 40 MG tablet Take 40 mg by mouth daily.        . Biotin 5000 MCG CAPS Take 1 capsule by mouth daily.      Marland Kitchen losartan-hydrochlorothiazide (HYZAAR) 100-12.5 MG per tablet Take 1  tablet by mouth daily.      Marland Kitchen omeprazole (PRILOSEC) 20 MG capsule Take 20 mg by mouth daily.        . Polysaccharide Iron Complex (POLY-IRON 150 PO) Take 1 tablet by mouth daily.       . Saxagliptin-Metformin 2.03-999 MG TB24 Take 2 tablets by mouth daily.       No current facility-administered medications for this visit.    REVIEW OF SYSTEMS:  A comprehensive review of systems was negative.   PHYSICAL EXAMINATION: General appearance: alert, cooperative and no distress Head: Normocephalic, without obvious abnormality, atraumatic Neck: no adenopathy Lymph nodes: Cervical, supraclavicular, and axillary nodes normal. Resp: clear to auscultation bilaterally Back: negative, symmetric, no curvature. ROM normal. No CVA tenderness. Cardio: regular rate and rhythm, S1, S2 normal, no murmur, click, rub or gallop GI: soft, non-tender; bowel sounds normal; no masses,  no organomegaly Extremities: extremities normal, atraumatic, no cyanosis or edema Neurologic: Alert and oriented X 3, normal strength and tone. Normal symmetric reflexes. Normal coordination and gait  ECOG PERFORMANCE STATUS: 0 - Asymptomatic  Blood pressure 126/87, pulse 107, temperature 98.9 F (37.2 C), temperature source Oral, resp. rate 18, height 5\' 7"  (1.702 m), weight 127 lb (57.607 kg).  LABORATORY DATA: Lab Results  Component Value Date   WBC 6.2 01/12/2013   HGB 14.5 01/12/2013   HCT 42.7 01/12/2013   MCV 90.2 01/12/2013   PLT 339 01/12/2013      Chemistry  Component Value Date/Time   NA 139 01/12/2013 0937   NA 139 06/06/2012 0934   K 3.9 01/12/2013 0937   K 4.0 06/06/2012 0934   CL 104 01/12/2013 0937   CL 100 06/06/2012 0934   CO2 24 01/12/2013 0937   CO2 24 06/06/2012 0934   BUN 12.7 01/12/2013 0937   BUN 14 06/06/2012 0934   CREATININE 0.9 01/12/2013 0937   CREATININE 0.87 06/06/2012 0934      Component Value Date/Time   CALCIUM 9.4 01/12/2013 0937   CALCIUM 10.0 06/06/2012 0934   ALKPHOS 86 04/06/2012 1126   AST 14  04/06/2012 1126   ALT 11 04/06/2012 1126   BILITOT 0.4 04/06/2012 1126       RADIOGRAPHIC STUDIES: No results found.  ASSESSMENT: This is a very pleasant 52 years old white female with history of deficiency anemia secondary to menorrhagia from fibroid tumors. She status post treatment with Feraheme in the past and her iron study today as well as hemoglobin and hematocrit are within the normal range.   PLAN: I discussed the lab result with the patient and her husband. I recommended for her to continue her current treatment with oral iron tablets. She would come back for followup visit in 3 months with repeat CBC, iron study and ferritin. She was advised to call me immediately if she has any concerning symptoms in the interval.  All questions were answered. The patient knows to call the clinic with any problems, questions or concerns. We can certainly see the patient much sooner if necessary.  I spent 20 minutes counseling the patient face to face. The total time spent in the appointment was 30 minutes.

## 2013-01-16 NOTE — Patient Instructions (Signed)
Your CBC and iron study are normal Followup in 3 months with repeat CBC and iron study. Continue oral iron tablets.

## 2013-01-19 ENCOUNTER — Ambulatory Visit: Payer: 59

## 2013-01-19 ENCOUNTER — Ambulatory Visit: Payer: 59 | Admitting: Hematology and Oncology

## 2013-01-19 ENCOUNTER — Other Ambulatory Visit: Payer: 59 | Admitting: Lab

## 2013-02-06 ENCOUNTER — Other Ambulatory Visit: Payer: Self-pay | Admitting: Obstetrics and Gynecology

## 2013-02-06 DIAGNOSIS — R928 Other abnormal and inconclusive findings on diagnostic imaging of breast: Secondary | ICD-10-CM

## 2013-02-19 ENCOUNTER — Other Ambulatory Visit: Payer: Self-pay | Admitting: Obstetrics and Gynecology

## 2013-02-19 ENCOUNTER — Ambulatory Visit
Admission: RE | Admit: 2013-02-19 | Discharge: 2013-02-19 | Disposition: A | Discharge status: Home / Self Care | Payer: 59 | Source: Ambulatory Visit | Attending: Obstetrics and Gynecology | Admitting: Obstetrics and Gynecology

## 2013-02-19 DIAGNOSIS — R928 Other abnormal and inconclusive findings on diagnostic imaging of breast: Secondary | ICD-10-CM

## 2013-02-22 ENCOUNTER — Ambulatory Visit
Admission: RE | Admit: 2013-02-22 | Discharge: 2013-02-22 | Disposition: A | Discharge status: Home / Self Care | Payer: 59 | Source: Ambulatory Visit | Attending: Obstetrics and Gynecology | Admitting: Obstetrics and Gynecology

## 2013-02-22 DIAGNOSIS — R928 Other abnormal and inconclusive findings on diagnostic imaging of breast: Secondary | ICD-10-CM

## 2013-04-16 ENCOUNTER — Other Ambulatory Visit (HOSPITAL_BASED_OUTPATIENT_CLINIC_OR_DEPARTMENT_OTHER): Payer: 59

## 2013-04-16 DIAGNOSIS — D539 Nutritional anemia, unspecified: Secondary | ICD-10-CM

## 2013-04-16 DIAGNOSIS — N920 Excessive and frequent menstruation with regular cycle: Secondary | ICD-10-CM

## 2013-04-16 LAB — CBC WITH DIFFERENTIAL/PLATELET
Basophils Absolute: 0.1 10*3/uL (ref 0.0–0.1)
EOS%: 3.8 % (ref 0.0–7.0)
HCT: 40.1 % (ref 34.8–46.6)
HGB: 13.6 g/dL (ref 11.6–15.9)
LYMPH%: 23.6 % (ref 14.0–49.7)
MCH: 30.3 pg (ref 25.1–34.0)
MONO#: 0.4 10*3/uL (ref 0.1–0.9)
NEUT%: 65.2 % (ref 38.4–76.8)
Platelets: 363 10*3/uL (ref 145–400)
lymph#: 1.3 10*3/uL (ref 0.9–3.3)

## 2013-04-16 LAB — IRON AND TIBC
TIBC: 349 ug/dL (ref 250–470)
UIBC: 285 ug/dL (ref 125–400)

## 2013-04-18 ENCOUNTER — Ambulatory Visit (HOSPITAL_BASED_OUTPATIENT_CLINIC_OR_DEPARTMENT_OTHER): Payer: 59 | Admitting: Internal Medicine

## 2013-04-18 ENCOUNTER — Telehealth: Payer: Self-pay | Admitting: Internal Medicine

## 2013-04-18 ENCOUNTER — Encounter: Payer: Self-pay | Admitting: Internal Medicine

## 2013-04-18 VITALS — BP 139/77 | HR 95 | Temp 98.0°F | Resp 18 | Ht 67.0 in | Wt 128.7 lb

## 2013-04-18 DIAGNOSIS — D539 Nutritional anemia, unspecified: Secondary | ICD-10-CM

## 2013-04-18 DIAGNOSIS — K5900 Constipation, unspecified: Secondary | ICD-10-CM

## 2013-04-18 DIAGNOSIS — D509 Iron deficiency anemia, unspecified: Secondary | ICD-10-CM

## 2013-04-18 DIAGNOSIS — N920 Excessive and frequent menstruation with regular cycle: Secondary | ICD-10-CM

## 2013-04-18 NOTE — Progress Notes (Signed)
[facility] Telephone:([phone]   Fax:([phone]  OFFICE PROGRESS NOTE  Chelsea Patella, MD [facility] [address] [place] [address]  DIAGNOSIS: Iron deficiency anemia secondary to menorrhagia   PRIOR THERAPY: Feraheme infusion on as-needed basis last dose was given on 06/08/2012.   CURRENT THERAPY: Poly-iron 150 mg by mouth daily.  INTERVAL HISTORY: Chelsea Espinoza 52 y.o. female returns to the clinic today for followup visit accompanied her husband. The patient is feeling fine today with no specific complaints. She denied having any significant fatigue weakness. She has no dizzy spells. She has no chest pain, shortness breath, cough or hemoptysis. She has no bleeding issues except for the menorrhagia. She was seen by her gynecologist and is considering hysterectomy in the near future. The patient had repeat CBC and iron study performed recently and she is here for evaluation and discussion of her lab results.  MEDICAL HISTORY: Past Medical History  Diagnosis Date  . Diabetes mellitus     type 2  . Hypertension   . Hyperlipidemia   . GERD (gastroesophageal reflux disease)     ALLERGIES:  is allergic to accupril.  MEDICATIONS:  Current Outpatient Prescriptions  Medication Sig Dispense Refill  . amLODipine (NORVASC) 5 MG tablet Take 5 mg by mouth daily.      Marland Kitchen atomoxetine (STRATTERA) 80 MG capsule Take 80 mg by mouth daily.        Marland Kitchen atorvastatin (LIPITOR) 40 MG tablet Take 40 mg by mouth daily.        . Biotin 5000 MCG CAPS Take 1 capsule by mouth daily.      Marland Kitchen losartan-hydrochlorothiazide (HYZAAR) 100-12.5 MG per tablet Take 1 tablet by mouth daily.      Marland Kitchen omeprazole (PRILOSEC) 20 MG capsule Take 20 mg by mouth daily.        . Polysaccharide Iron Complex (POLY-IRON 150 PO) Take 1 tablet by mouth daily.       . Saxagliptin-Metformin 2.03-999 MG TB24 Take 2 tablets by mouth daily.       No current  facility-administered medications for this visit.    REVIEW OF SYSTEMS:  A comprehensive review of systems was negative.   PHYSICAL EXAMINATION: General appearance: alert, cooperative and no distress Head: Normocephalic, without obvious abnormality, atraumatic Neck: no adenopathy Lymph nodes: Cervical, supraclavicular, and axillary nodes normal. Resp: clear to auscultation bilaterally Cardio: regular rate and rhythm, S1, S2 normal, no murmur, click, rub or gallop GI: soft, non-tender; bowel sounds normal; no masses,  no organomegaly Extremities: extremities normal, atraumatic, no cyanosis or edema  ECOG PERFORMANCE STATUS: 0 - Asymptomatic  There were no vitals taken for this visit.  LABORATORY DATA: Lab Results  Component Value Date   WBC 5.4 04/16/2013   HGB 13.6 04/16/2013   HCT 40.1 04/16/2013   MCV 89.1 04/16/2013   PLT 363 04/16/2013      Chemistry      Component Value Date/Time   NA 139 01/12/2013 0937   NA 139 06/06/2012 0934   K 3.9 01/12/2013 0937   K 4.0 06/06/2012 0934   CL 104 01/12/2013 0937   CL 100 06/06/2012 0934   CO2 24 01/12/2013 0937   CO2 24 06/06/2012 0934   BUN 12.7 01/12/2013 0937   BUN 14 06/06/2012 0934   CREATININE 0.9 01/12/2013 0937   CREATININE 0.87 06/06/2012 0934      Component Value Date/Time   CALCIUM 9.4 01/12/2013  6295   CALCIUM 10.0 06/06/2012 0934   ALKPHOS 86 04/06/2012 1126   AST 14 04/06/2012 1126   ALT 11 04/06/2012 1126   BILITOT 0.4 04/06/2012 1126        RADIOGRAPHIC STUDIES: No results found.  ASSESSMENT: This is a very pleasant 52 years old white female with history of iron deficiency anemia secondary to menorrhagia currently on oral iron tablets and tolerating it fairly well except for constipation. Iron study today still normal but trending to the lower level.   PLAN: I discussed the lab result with the patient today. I recommended for her to consider Feraheme infusion x2 doses. The first dose will be given on 04/20/2013. She was also  advised to continue with the oral iron tablets. I would see the patient back for followup visit in 3 months with repeat CBC and iron study. She was advised to call immediately if she has any concerning symptoms in the interval.  All questions were answered. The patient knows to call the clinic with any problems, questions or concerns. We can certainly see the patient much sooner if necessary.

## 2013-04-18 NOTE — Patient Instructions (Signed)
We discussed Feraheme infusion. Followup visit in 3 months with repeat iron study.

## 2013-04-18 NOTE — Telephone Encounter (Signed)
gv and printed appt sched and avs for pt  °

## 2013-04-20 ENCOUNTER — Ambulatory Visit (HOSPITAL_BASED_OUTPATIENT_CLINIC_OR_DEPARTMENT_OTHER): Payer: 59

## 2013-04-20 VITALS — BP 143/86 | HR 85 | Temp 98.0°F

## 2013-04-20 DIAGNOSIS — D539 Nutritional anemia, unspecified: Secondary | ICD-10-CM

## 2013-04-20 DIAGNOSIS — D509 Iron deficiency anemia, unspecified: Secondary | ICD-10-CM

## 2013-04-20 MED ORDER — SODIUM CHLORIDE 0.9 % IV SOLN
Freq: Once | INTRAVENOUS | Status: AC
  Administered 2013-04-20: 10:00:00 via INTRAVENOUS

## 2013-04-20 MED ORDER — FERUMOXYTOL INJECTION 510 MG/17 ML
510.0000 mg | Freq: Once | INTRAVENOUS | Status: AC
  Administered 2013-04-20: 510 mg via INTRAVENOUS
  Filled 2013-04-20: qty 17

## 2013-04-20 MED ORDER — HEPARIN SOD (PORK) LOCK FLUSH 100 UNIT/ML IV SOLN
250.0000 [IU] | Freq: Once | INTRAVENOUS | Status: DC | PRN
  Filled 2013-04-20: qty 5

## 2013-04-20 NOTE — Patient Instructions (Signed)
Ferumoxytol injection What is this medicine? FERUMOXYTOL is an iron complex. Iron is used to make healthy red blood cells, which carry oxygen and nutrients throughout the body. This medicine is used to treat iron deficiency anemia in people with chronic kidney disease. This medicine may be used for other purposes; ask your health care provider or pharmacist if you have questions. What should I tell my health care provider before I take this medicine? They need to know if you have any of these conditions: -anemia not caused by low iron levels -high levels of iron in the blood -magnetic resonance imaging (MRI) test scheduled -an unusual or allergic reaction to iron, other medicines, foods, dyes, or preservatives -pregnant or trying to get pregnant -breast-feeding How should I use this medicine? This medicine is for infusion into a vein. It is given by a health care professional in a hospital or clinic setting. Talk to your pediatrician regarding the use of this medicine in children. Special care may be needed. Overdosage: If you think you've taken too much of this medicine contact a poison control center or emergency room at once. Overdosage: If you think you have taken too much of this medicine contact a poison control center or emergency room at once. NOTE: This medicine is only for you. Do not share this medicine with others. What if I miss a dose? It is important not to miss your dose. Call your doctor or health care professional if you are unable to keep an appointment. What may interact with this medicine? This medicine may interact with the following medications: -other iron products This list may not describe all possible interactions. Give your health care provider a list of all the medicines, herbs, non-prescription drugs, or dietary supplements you use. Also tell them if you smoke, drink alcohol, or use illegal drugs. Some items may interact with your medicine. What should I watch  for while using this medicine? Visit your doctor or healthcare professional regularly. Tell your doctor or healthcare professional if your symptoms do not start to get better or if they get worse. You may need blood work done while you are taking this medicine. You may need to follow a special diet. Talk to your doctor. Foods that contain iron include: whole grains/cereals, dried fruits, beans, or peas, leafy green vegetables, and organ meats (liver, kidney). What side effects may I notice from receiving this medicine? Side effects that you should report to your doctor or health care professional as soon as possible: -allergic reactions like skin rash, itching or hives, swelling of the face, lips, or tongue -breathing problems -changes in blood pressure -feeling faint or lightheaded, falls -fever or chills -flushing, sweating, or hot feelings -swelling of the ankles or feet Side effects that usually do not require medical attention (Report these to your doctor or health care professional if they continue or are bothersome.): -diarrhea -headache -nausea, vomiting -stomach pain This list may not describe all possible side effects. Call your doctor for medical advice about side effects. You may report side effects to FDA at 1-800-FDA-1088. Where should I keep my medicine? This drug is given in a hospital or clinic and will not be stored at home. NOTE: This sheet is a summary. It may not cover all possible information. If you have questions about this medicine, talk to your doctor, pharmacist, or health care provider.  2013, Elsevier/Gold Standard. (07/17/2008 9:48:25 PM)  

## 2013-04-27 ENCOUNTER — Ambulatory Visit (HOSPITAL_BASED_OUTPATIENT_CLINIC_OR_DEPARTMENT_OTHER): Payer: 59

## 2013-04-27 VITALS — BP 130/80 | HR 89 | Temp 98.6°F

## 2013-04-27 DIAGNOSIS — D539 Nutritional anemia, unspecified: Secondary | ICD-10-CM

## 2013-04-27 DIAGNOSIS — D509 Iron deficiency anemia, unspecified: Secondary | ICD-10-CM

## 2013-04-27 MED ORDER — SODIUM CHLORIDE 0.9 % IV SOLN
Freq: Once | INTRAVENOUS | Status: AC
  Administered 2013-04-27: 10:00:00 via INTRAVENOUS

## 2013-04-27 MED ORDER — FERUMOXYTOL INJECTION 510 MG/17 ML
510.0000 mg | Freq: Once | INTRAVENOUS | Status: AC
  Administered 2013-04-27: 510 mg via INTRAVENOUS
  Filled 2013-04-27: qty 17

## 2013-04-27 NOTE — Patient Instructions (Addendum)
Ferumoxytol injection What is this medicine? FERUMOXYTOL is an iron complex. Iron is used to make healthy red blood cells, which carry oxygen and nutrients throughout the body. This medicine is used to treat iron deficiency anemia in people with chronic kidney disease. This medicine may be used for other purposes; ask your health care provider or pharmacist if you have questions. What should I tell my health care provider before I take this medicine? They need to know if you have any of these conditions: -anemia not caused by low iron levels -high levels of iron in the blood -magnetic resonance imaging (MRI) test scheduled -an unusual or allergic reaction to iron, other medicines, foods, dyes, or preservatives -pregnant or trying to get pregnant -breast-feeding How should I use this medicine? This medicine is for infusion into a vein. It is given by a health care professional in a hospital or clinic setting. Talk to your pediatrician regarding the use of this medicine in children. Special care may be needed. Overdosage: If you think you've taken too much of this medicine contact a poison control center or emergency room at once. Overdosage: If you think you have taken too much of this medicine contact a poison control center or emergency room at once. NOTE: This medicine is only for you. Do not share this medicine with others. What if I miss a dose? It is important not to miss your dose. Call your doctor or health care professional if you are unable to keep an appointment. What may interact with this medicine? This medicine may interact with the following medications: -other iron products This list may not describe all possible interactions. Give your health care provider a list of all the medicines, herbs, non-prescription drugs, or dietary supplements you use. Also tell them if you smoke, drink alcohol, or use illegal drugs. Some items may interact with your medicine. What should I watch  for while using this medicine? Visit your doctor or healthcare professional regularly. Tell your doctor or healthcare professional if your symptoms do not start to get better or if they get worse. You may need blood work done while you are taking this medicine. You may need to follow a special diet. Talk to your doctor. Foods that contain iron include: whole grains/cereals, dried fruits, beans, or peas, leafy green vegetables, and organ meats (liver, kidney). What side effects may I notice from receiving this medicine? Side effects that you should report to your doctor or health care professional as soon as possible: -allergic reactions like skin rash, itching or hives, swelling of the face, lips, or tongue -breathing problems -changes in blood pressure -feeling faint or lightheaded, falls -fever or chills -flushing, sweating, or hot feelings -swelling of the ankles or feet Side effects that usually do not require medical attention (Report these to your doctor or health care professional if they continue or are bothersome.): -diarrhea -headache -nausea, vomiting -stomach pain This list may not describe all possible side effects. Call your doctor for medical advice about side effects. You may report side effects to FDA at 1-800-FDA-1088. Where should I keep my medicine? This drug is given in a hospital or clinic and will not be stored at home. NOTE: This sheet is a summary. It may not cover all possible information. If you have questions about this medicine, talk to your doctor, pharmacist, or health care provider.  2013, Elsevier/Gold Standard. (07/17/2008 9:48:25 PM)  

## 2013-07-06 NOTE — H&P (Signed)
  Patient name  Chelsea Espinoza, Chelsea Espinoza DICTATION#  [id number] CSN# [id number]  Juluis Mire, MD 07/06/2013 8:03 AM

## 2013-07-07 NOTE — H&P (Signed)
Chelsea Espinoza, Chelsea Espinoza NO.:  [id number]  MEDICAL RECORD NO.:  [id number]  LOCATION:                                 FACILITY:  PHYSICIAN:  Juluis Mire, M.D.        DATE OF BIRTH:  DATE OF ADMISSION: DATE OF DISCHARGE:                             HISTORY & PHYSICAL   DATE OF SURGERY:  July 26, 2013, at [facility] here in [place].  HISTORY OF PRESENT ILLNESS:  The patient is a 52 year old gravida 2, para 2 female, presents for LAVH.  The patient had a history of uterine fibroids with associated heavy flow.  She has had significant anemia, thus required iron transfusions. We did a cryoablation in 2003, that did not really help.  She recently completed a 55-month course of Lupron Depot.  She had no bleeding during that time, but now that she stopped.  She is having cycles, again they are relatively heavy.  Her hemoglobin has remained stable.  She has decided, because of the continued bleeding, to proceed with LAVH for which she is admitted at the present time.  Possible BSO.  ALLERGIES:  In terms of allergies, she is allergic to ACCUPRIL.  MEDICATIONS:  She is on a calcium channel blocker, Strattera for attention deficit, Lipitor 40 mg a day, biotin, losartan with hydrochlorothiazide, Prilosec, and she is on metformin and Januvia for her diabetes.  PAST MEDICAL HISTORY:  She is admitted for history of hypertension, hyperlipidemia, and glucose intolerance for which she is on the above- noted medications.  PAST SURGICAL HISTORY:  She had a previous cryoablation as noted.  She has had 2 vaginal deliveries.  SOCIAL HISTORY:  Reveals no tobacco use and minimal alcohol use.  FAMILY HISTORY:  Noncontributory.  REVIEW OF SYSTEMS:  Noncontributory.  PHYSICAL EXAMINATION:  VITAL SIGNS:  The patient is afebrile, stable vital signs. HEENT:  The patient is normocephalic.  Pupils equal, round, and reactive to light and accommodation.  Extraocular  movements were intact.  Sclerae and conjunctivae clear.  Oropharynx clear. NECK:  Without thyromegaly. BREASTS:  No discrete masses. LUNGS:  Clear. CARDIOVASCULAR:  Regular rhythm rate.  There are no murmurs or gallops. No carotid or abdominal bruits. ABDOMEN:  Benign.  No masses, organomegaly, or tenderness. PELVIC:  Normal external genitalia.  Vaginal mucosa is clear.  Cervix unremarkable.  Uterus is enlarged, 9 to 10 weeks with new fibroids.  She has 1 large fibroid on the left side.  It measured approximately 4 to 5 cm.  Adnexa unremarkable. NEUROLOGIC:  Grossly within normal limits. EXTREMITIES:  Trace edema.  IMPRESSION: 1. Uterine fibroids with associated heavy bleeding and anemia. 2. Hypertension. 3. Hyperlipidemia. 4. Glucose intolerance. 5. Previous cryoablation.  PLAN:  At the present time, we had discussed options.  We discussed Depo- Provera versus possibly a Mirena IUD.  We also discussed radiological embolization.  The patient is in favor of proceeding with LAVH with probable BSO.  The risk of procedure have been discussed including the risk of infection.  The risk of hemorrhage that could require transfusion with the risk of AIDS or hepatitis.  Risk of injury to adjacent organs  including bladder, bowel, ureters that could require further exploratory surgery.  Risk of deep venous thrombosis and pulmonary embolus.  The patient does understand the indications, risks, and potential complications.  She also understands alternatives.  If both ovaries are removed, we will proceed with cystoscopy.     Juluis Mire, M.D.     JSM/MEDQ  D:  07/06/2013  T:  07/07/2013  Job:  [id number]

## 2013-07-10 ENCOUNTER — Encounter (HOSPITAL_COMMUNITY): Payer: Self-pay | Admitting: Pharmacist

## 2013-07-17 ENCOUNTER — Other Ambulatory Visit (HOSPITAL_BASED_OUTPATIENT_CLINIC_OR_DEPARTMENT_OTHER): Payer: 59 | Admitting: Lab

## 2013-07-17 DIAGNOSIS — D509 Iron deficiency anemia, unspecified: Secondary | ICD-10-CM

## 2013-07-17 DIAGNOSIS — D539 Nutritional anemia, unspecified: Secondary | ICD-10-CM

## 2013-07-17 DIAGNOSIS — N920 Excessive and frequent menstruation with regular cycle: Secondary | ICD-10-CM

## 2013-07-17 LAB — CBC WITH DIFFERENTIAL/PLATELET
Eosinophils Absolute: 0.2 10*3/uL (ref 0.0–0.5)
MCV: 91.2 fL (ref 79.5–101.0)
MONO%: 7 % (ref 0.0–14.0)
NEUT#: 4.1 10*3/uL (ref 1.5–6.5)
RBC: 4.57 10*6/uL (ref 3.70–5.45)
RDW: 13.1 % (ref 11.2–14.5)
WBC: 5.8 10*3/uL (ref 3.9–10.3)
lymph#: 1.1 10*3/uL (ref 0.9–3.3)

## 2013-07-17 LAB — IRON AND TIBC CHCC
%SAT: 34 % (ref 21–57)
Iron: 96 ug/dL (ref 41–142)

## 2013-07-17 LAB — FERRITIN CHCC: Ferritin: 141 ng/ml (ref 9–269)

## 2013-07-18 ENCOUNTER — Encounter (HOSPITAL_COMMUNITY)
Admission: RE | Admit: 2013-07-18 | Discharge: 2013-07-18 | Disposition: A | Discharge status: Home / Self Care | Payer: 59 | Source: Ambulatory Visit | Attending: Obstetrics and Gynecology | Admitting: Obstetrics and Gynecology

## 2013-07-18 ENCOUNTER — Other Ambulatory Visit: Payer: Self-pay

## 2013-07-18 ENCOUNTER — Encounter (HOSPITAL_COMMUNITY): Payer: Self-pay

## 2013-07-18 DIAGNOSIS — Z01810 Encounter for preprocedural cardiovascular examination: Secondary | ICD-10-CM | POA: Insufficient documentation

## 2013-07-18 DIAGNOSIS — Z01812 Encounter for preprocedural laboratory examination: Secondary | ICD-10-CM | POA: Insufficient documentation

## 2013-07-18 DIAGNOSIS — Z01818 Encounter for other preprocedural examination: Secondary | ICD-10-CM | POA: Insufficient documentation

## 2013-07-18 LAB — BASIC METABOLIC PANEL
CO2: 31 mEq/L (ref 19–32)
Calcium: 9.8 mg/dL (ref 8.4–10.5)
Creatinine, Ser: 0.7 mg/dL (ref 0.50–1.10)
Glucose, Bld: 300 mg/dL — ABNORMAL HIGH (ref 70–99)

## 2013-07-18 LAB — CBC
MCH: 30.2 pg (ref 26.0–34.0)
MCV: 90.9 fL (ref 78.0–100.0)
Platelets: 374 10*3/uL (ref 150–400)
RDW: 12.6 % (ref 11.5–15.5)
WBC: 5.7 10*3/uL (ref 4.0–10.5)

## 2013-07-18 NOTE — Patient Instructions (Addendum)
Your procedure is scheduled on:07/26/13  Enter through the Main Entrance at :6am Pick up desk phone and dial [phone] and inform us of your arrival.  Please call [phone] if you have any problems the morning of surgery.  Remember: Do not eat food or drink liquids, including water, after midnight: Wed. 07/25/13   You may brush your teeth the morning of surgery.  Take these meds the morning of surgery with a sip of water :BP meds   HOLD METFORMIN FOR 24 HOURS PRIOR TO SURGERY  DO NOT wear jewelry, eye make-up, lipstick,body lotion, or dark fingernail polish.  (Polished toes are ok) You may wear deodorant.  If you are to be admitted after surgery, leave suitcase in car until your room has been assigned. Patients discharged on the day of surgery will not be allowed to drive home. Wear loose fitting, comfortable clothes for your ride home.

## 2013-07-18 NOTE — Anesthesia Preprocedure Evaluation (Signed)
Anesthesia Evaluation  Patient identified by MRN, date of birth, ID band Patient awake    Reviewed: Allergy & Precautions, H&P , NPO status , Patient's Chart, lab work & pertinent test results, reviewed documented beta blocker date and time   History of Anesthesia Complications Negative for: history of anesthetic complications  Airway Mallampati: II      Dental  (+) Teeth Intact   Pulmonary neg pulmonary ROS,  breath sounds clear to auscultation  Pulmonary exam normal       Cardiovascular Exercise Tolerance: Good hypertension, On Medications Rhythm:regular Rate:Normal  hyperlipidemia   Neuro/Psych negative neurological ROS  negative psych ROS   GI/Hepatic Neg liver ROS, GERD-  Medicated,  Endo/Other  diabetes, Type 2, Oral Hypoglycemic Agents  Renal/GU negative Renal ROS  Female GU complaint     Musculoskeletal   Abdominal   Peds  Hematology  (+) anemia , Iron-deficiency - gets iron infusions   Anesthesia Other Findings   Reproductive/Obstetrics negative OB ROS                           Anesthesia Physical Anesthesia Plan  ASA: III  Anesthesia Plan: General ETT   Post-op Pain Management:    Induction:   Airway Management Planned:   Additional Equipment:   Intra-op Plan:   Post-operative Plan:   Informed Consent: I have reviewed the patients History and Physical, chart, labs and discussed the procedure including the risks, benefits and alternatives for the proposed anesthesia with the patient or authorized representative who has indicated his/her understanding and acceptance.   Dental Advisory Given  Plan Discussed with: CRNA and Surgeon  Anesthesia Plan Comments:         Anesthesia Quick Evaluation

## 2013-07-19 ENCOUNTER — Telehealth: Payer: Self-pay | Admitting: Internal Medicine

## 2013-07-19 ENCOUNTER — Ambulatory Visit (HOSPITAL_BASED_OUTPATIENT_CLINIC_OR_DEPARTMENT_OTHER): Payer: 59 | Admitting: Internal Medicine

## 2013-07-19 ENCOUNTER — Encounter: Payer: Self-pay | Admitting: Internal Medicine

## 2013-07-19 ENCOUNTER — Encounter (HOSPITAL_COMMUNITY): Payer: Self-pay

## 2013-07-19 VITALS — BP 142/75 | HR 97 | Temp 98.1°F | Resp 20 | Ht 67.0 in | Wt 129.4 lb

## 2013-07-19 DIAGNOSIS — D539 Nutritional anemia, unspecified: Secondary | ICD-10-CM

## 2013-07-19 NOTE — Pre-Procedure Instructions (Addendum)
Chelsea Espinoza in Dr. Lisbeth Ply office spoke with pt who said her A1C in August 2014 was 6.4 and the reason her glucose was high at PRE-OP appt was because she took her DM med just before coming to hospital. I reported this to Dr. Sherron Ales who wants pt to be informed that she needs to check her BS several times daily and if < 150, she can have surgery. If BSs are > 150, she needs to see her PCP or risk surgery being cancelled. Chelsea Espinoza to notify pt.

## 2013-07-19 NOTE — Pre-Procedure Instructions (Signed)
Dr. Sheral Apley notified of glucose result of 300. Per Dr Jean Rosenthal pt needs to be seen by her endocrinologist. [facility] for Chelsea Espinoza at Dr. Lisbeth Ply office.

## 2013-07-19 NOTE — Telephone Encounter (Signed)
gv and printed appt sched and avs for pt for Dec °

## 2013-07-19 NOTE — Progress Notes (Signed)
[facility] Telephone:([phone]   Fax:([phone]  OFFICE PROGRESS NOTE  [name], MD [facility] [address] [place] [address]  DIAGNOSIS: Iron deficiency anemia secondary to menorrhagia   PRIOR THERAPY: Feraheme infusion on as-needed basis last dose was given on 06/08/2012.   CURRENT THERAPY: Poly-iron 150 mg by mouth daily.  INTERVAL HISTORY: Chelsea Espinoza 52 y.o. female returns to the clinic today for followup visit accompanied her husband. The patient is feeling fine today with no specific complaints except for occasional fatigue. She is scheduled to have hysterectomy on 07/26/2013 by Dr. Arelia Sneddon. She denied having any significant dizzy spells. The patient denied having any bleeding issues. She denied having any chest pain, shortness breath, cough or hemoptysis. She had repeat CBC and iron study and she is here for evaluation and discussion of her lab results be  MEDICAL HISTORY: Past Medical History  Diagnosis Date  . Diabetes mellitus     type 2  . Hypertension   . Hyperlipidemia   . GERD (gastroesophageal reflux disease)     ALLERGIES:  is allergic to accupril.  MEDICATIONS:  Current Outpatient Prescriptions  Medication Sig Dispense Refill  . amLODipine (NORVASC) 5 MG tablet Take 5 mg by mouth daily.      Marland Kitchen aspirin 325 MG tablet Take 325 mg by mouth daily.      Marland Kitchen atomoxetine (STRATTERA) 80 MG capsule Take 80 mg by mouth daily.        Marland Kitchen atorvastatin (LIPITOR) 40 MG tablet Take 40 mg by mouth daily.        . Biotin 5000 MCG CAPS Take 1 capsule by mouth daily.      Marland Kitchen losartan-hydrochlorothiazide (HYZAAR) 100-12.5 MG per tablet Take 1 tablet by mouth daily.      Marland Kitchen omeprazole (PRILOSEC) 20 MG capsule Take 20 mg by mouth as needed.       . Polysaccharide Iron Complex (POLY-IRON 150 PO) Take 1 tablet by mouth daily.       . Saxagliptin-Metformin (KOMBIGLYZE XR) 2.03-999 MG TB24 Take 2 tablets by  mouth daily after breakfast.        No current facility-administered medications for this visit.    SURGICAL HISTORY:  Past Surgical History  Procedure Laterality Date  . Cryoablation    . No past surgeries      REVIEW OF SYSTEMS:  A comprehensive review of systems was negative.   PHYSICAL EXAMINATION: General appearance: alert, cooperative and no distress Head: Normocephalic, without obvious abnormality, atraumatic Neck: no adenopathy Lymph nodes: Cervical, supraclavicular, and axillary nodes normal. Resp: clear to auscultation bilaterally Cardio: regular rate and rhythm, S1, S2 normal, no murmur, click, rub or gallop GI: soft, non-tender; bowel sounds normal; no masses,  no organomegaly Extremities: extremities normal, atraumatic, no cyanosis or edema  ECOG PERFORMANCE STATUS: 0 - Asymptomatic  Blood pressure 142/75, pulse 97, temperature 98.1 F (36.7 C), temperature source Oral, resp. rate 20, height 5\' 7"  (1.702 m), weight 129 lb 6.4 oz (58.695 kg), last menstrual period 06/18/2013.  LABORATORY DATA: Lab Results  Component Value Date   WBC 5.7 07/18/2013   HGB 13.9 07/18/2013   HCT 41.9 07/18/2013   MCV 90.9 07/18/2013   PLT 374 07/18/2013      Chemistry      Component Value Date/Time   NA 136 07/18/2013 1040   NA 139 01/12/2013 0937   K 3.5 07/18/2013 1040   K 3.9  01/12/2013 0937   CL 97 07/18/2013 1040   CL 104 01/12/2013 0937   CO2 31 07/18/2013 1040   CO2 24 01/12/2013 0937   BUN 12 07/18/2013 1040   BUN 12.7 01/12/2013 0937   CREATININE 0.70 07/18/2013 1040   CREATININE 0.9 01/12/2013 0937      Component Value Date/Time   CALCIUM 9.8 07/18/2013 1040   CALCIUM 9.4 01/12/2013 0937   ALKPHOS 86 04/06/2012 1126   AST 14 04/06/2012 1126   ALT 11 04/06/2012 1126   BILITOT 0.4 04/06/2012 1126       RADIOGRAPHIC STUDIES: No results found.  ASSESSMENT AND PLAN: This is a very pleasant 52 years old white female with history of iron deficiency anemia status post Feraheme infusion  and currently on oral tablets. The patient is feeling fine and she has no significant evidence for anemia or iron deficiency. I recommended for her to proceed with the hysterectomy as scheduled. I would see her back for followup visit in 3 months with repeat CBC, iron study and ferritin. She was advised to call immediately if she has any concerning symptoms in the interval. The patient voices understanding of current disease status and treatment options and is in agreement with the current care plan.  All questions were answered. The patient knows to call the clinic with any problems, questions or concerns. We can certainly see the patient much sooner if necessary.

## 2013-07-24 NOTE — Pre-Procedure Instructions (Signed)
Chelsea Espinoza in Dr. Lisbeth Ply office sent copy of A1C result from  Dr. Nicholos Johns, who treats pt for DM. [name] reported that pt has been checking her blood sugars at home and results have been < 150 after changing time of taking her DM med per Dr. Benjaman Pott instructions.

## 2013-07-26 ENCOUNTER — Ambulatory Visit (HOSPITAL_COMMUNITY): Payer: 59 | Admitting: Anesthesiology

## 2013-07-26 ENCOUNTER — Observation Stay (HOSPITAL_COMMUNITY)
Admission: RE | Admit: 2013-07-26 | Discharge: 2013-07-27 | Disposition: A | Discharge status: Home / Self Care | Payer: 59 | Source: Ambulatory Visit | Attending: Obstetrics and Gynecology | Admitting: Obstetrics and Gynecology

## 2013-07-26 ENCOUNTER — Encounter (HOSPITAL_COMMUNITY): Payer: Self-pay | Admitting: Anesthesiology

## 2013-07-26 ENCOUNTER — Encounter (HOSPITAL_COMMUNITY)
Admission: RE | Disposition: A | Discharge status: Home / Self Care | Payer: Self-pay | Source: Ambulatory Visit | Attending: Obstetrics and Gynecology

## 2013-07-26 DIAGNOSIS — N8000 Endometriosis of the uterus, unspecified: Secondary | ICD-10-CM | POA: Insufficient documentation

## 2013-07-26 DIAGNOSIS — Z90710 Acquired absence of both cervix and uterus: Secondary | ICD-10-CM | POA: Clinically undetermined

## 2013-07-26 DIAGNOSIS — D259 Leiomyoma of uterus, unspecified: Secondary | ICD-10-CM | POA: Diagnosis present

## 2013-07-26 DIAGNOSIS — I10 Essential (primary) hypertension: Secondary | ICD-10-CM | POA: Insufficient documentation

## 2013-07-26 DIAGNOSIS — E739 Lactose intolerance, unspecified: Secondary | ICD-10-CM | POA: Insufficient documentation

## 2013-07-26 DIAGNOSIS — E785 Hyperlipidemia, unspecified: Secondary | ICD-10-CM | POA: Insufficient documentation

## 2013-07-26 DIAGNOSIS — D250 Submucous leiomyoma of uterus: Secondary | ICD-10-CM | POA: Insufficient documentation

## 2013-07-26 DIAGNOSIS — D252 Subserosal leiomyoma of uterus: Principal | ICD-10-CM | POA: Insufficient documentation

## 2013-07-26 DIAGNOSIS — D251 Intramural leiomyoma of uterus: Secondary | ICD-10-CM | POA: Insufficient documentation

## 2013-07-26 DIAGNOSIS — N920 Excessive and frequent menstruation with regular cycle: Secondary | ICD-10-CM | POA: Insufficient documentation

## 2013-07-26 HISTORY — PX: LAPAROSCOPIC ASSISTED VAGINAL HYSTERECTOMY: SHX5398

## 2013-07-26 LAB — GLUCOSE, CAPILLARY
Glucose-Capillary: 174 mg/dL — ABNORMAL HIGH (ref 70–99)
Glucose-Capillary: 147 mg/dL — ABNORMAL HIGH (ref 70–99)
Glucose-Capillary: 80 mg/dL (ref 70–99)
Glucose-Capillary: 180 mg/dL — ABNORMAL HIGH (ref 70–99)

## 2013-07-26 SURGERY — HYSTERECTOMY, VAGINAL, LAPAROSCOPY-ASSISTED
Anesthesia: General | Site: Abdomen | Wound class: Clean Contaminated

## 2013-07-26 MED ORDER — LOSARTAN POTASSIUM 50 MG PO TABS
100.0000 mg | ORAL_TABLET | Freq: Every day | ORAL | Status: DC
  Administered 2013-07-27: 100 mg via ORAL
  Filled 2013-07-26: qty 2

## 2013-07-26 MED ORDER — METOCLOPRAMIDE HCL 5 MG/ML IJ SOLN: 10.0000 mg | Freq: Once | INTRAMUSCULAR | Status: DC | PRN

## 2013-07-26 MED ORDER — ASPIRIN 325 MG PO TABS
325.0000 mg | ORAL_TABLET | Freq: Every day | ORAL | Status: DC
  Administered 2013-07-27: 325 mg via ORAL
  Filled 2013-07-26 (×2): qty 1

## 2013-07-26 MED ORDER — METFORMIN HCL ER 750 MG PO TB24
2000.0000 mg | ORAL_TABLET | Freq: Every day | ORAL | Status: DC
  Administered 2013-07-26 – 2013-07-27 (×2): 2000 mg via ORAL
  Filled 2013-07-26 (×2): qty 1

## 2013-07-26 MED ORDER — ATORVASTATIN CALCIUM 40 MG PO TABS
40.0000 mg | ORAL_TABLET | Freq: Every day | ORAL | Status: DC
  Administered 2013-07-26 – 2013-07-27 (×2): 40 mg via ORAL
  Filled 2013-07-26 (×2): qty 1

## 2013-07-26 MED ORDER — DIPHENHYDRAMINE HCL 50 MG/ML IJ SOLN: 12.5000 mg | Freq: Four times a day (QID) | INTRAMUSCULAR | Status: DC | PRN

## 2013-07-26 MED ORDER — EPHEDRINE SULFATE 50 MG/ML IJ SOLN
INTRAMUSCULAR | Status: DC | PRN
  Administered 2013-07-26: 10 mg via INTRAVENOUS
  Administered 2013-07-26 (×2): 5 mg via INTRAVENOUS

## 2013-07-26 MED ORDER — MIDAZOLAM HCL 2 MG/2ML IJ SOLN
INTRAMUSCULAR | Status: AC
  Filled 2013-07-26: qty 2

## 2013-07-26 MED ORDER — 0.9 % SODIUM CHLORIDE (POUR BTL) OPTIME
TOPICAL | Status: DC | PRN
  Administered 2013-07-26: 1000 mL

## 2013-07-26 MED ORDER — LINAGLIPTIN 5 MG PO TABS
5.0000 mg | ORAL_TABLET | Freq: Every day | ORAL | Status: DC
  Administered 2013-07-26 – 2013-07-27 (×2): 5 mg via ORAL
  Filled 2013-07-26 (×2): qty 1

## 2013-07-26 MED ORDER — DIPHENHYDRAMINE HCL 12.5 MG/5ML PO ELIX: 12.5000 mg | ORAL_SOLUTION | Freq: Four times a day (QID) | ORAL | Status: DC | PRN

## 2013-07-26 MED ORDER — BUPIVACAINE HCL (PF) 0.25 % IJ SOLN
INTRAMUSCULAR | Status: DC | PRN
  Administered 2013-07-26: 30 mL

## 2013-07-26 MED ORDER — HYDROMORPHONE 0.3 MG/ML IV SOLN
INTRAVENOUS | Status: DC
  Administered 2013-07-26: 3.33 mL via INTRAVENOUS
  Administered 2013-07-26: 0.2 mL via INTRAVENOUS
  Administered 2013-07-26: 1.59 mg via INTRAVENOUS
  Filled 2013-07-26: qty 25

## 2013-07-26 MED ORDER — DEXAMETHASONE SODIUM PHOSPHATE 10 MG/ML IJ SOLN
INTRAMUSCULAR | Status: AC
  Filled 2013-07-26: qty 1

## 2013-07-26 MED ORDER — SODIUM CHLORIDE 0.9 % IJ SOLN: 9.0000 mL | INTRAMUSCULAR | Status: DC | PRN

## 2013-07-26 MED ORDER — INSULIN ASPART 100 UNIT/ML ~~LOC~~ SOLN
4.0000 [IU] | Freq: Once | SUBCUTANEOUS | Status: DC
  Filled 2013-07-26: qty 0.04

## 2013-07-26 MED ORDER — MIDAZOLAM HCL 5 MG/5ML IJ SOLN
INTRAMUSCULAR | Status: DC | PRN
  Administered 2013-07-26: 2 mg via INTRAVENOUS

## 2013-07-26 MED ORDER — ROCURONIUM BROMIDE 100 MG/10ML IV SOLN
INTRAVENOUS | Status: DC | PRN
  Administered 2013-07-26: 40 mg via INTRAVENOUS

## 2013-07-26 MED ORDER — LIDOCAINE HCL (CARDIAC) 20 MG/ML IV SOLN
INTRAVENOUS | Status: DC | PRN
  Administered 2013-07-26: 60 mg via INTRAVENOUS

## 2013-07-26 MED ORDER — ONDANSETRON HCL 4 MG PO TABS: 4.0000 mg | ORAL_TABLET | Freq: Four times a day (QID) | ORAL | Status: DC | PRN

## 2013-07-26 MED ORDER — EPHEDRINE 5 MG/ML INJ
INTRAVENOUS | Status: AC
  Filled 2013-07-26: qty 10

## 2013-07-26 MED ORDER — MENTHOL 3 MG MT LOZG: 1.0000 | LOZENGE | OROMUCOSAL | Status: DC | PRN

## 2013-07-26 MED ORDER — CEFAZOLIN SODIUM-DEXTROSE 2-3 GM-% IV SOLR: 2.0000 g | INTRAVENOUS | Status: DC

## 2013-07-26 MED ORDER — LACTATED RINGERS IV SOLN
INTRAVENOUS | Status: DC
  Administered 2013-07-26: 17:00:00 via INTRAVENOUS

## 2013-07-26 MED ORDER — KETOROLAC TROMETHAMINE 30 MG/ML IJ SOLN
INTRAMUSCULAR | Status: DC | PRN
  Administered 2013-07-26: 30 mg via INTRAVENOUS

## 2013-07-26 MED ORDER — PROPOFOL 10 MG/ML IV EMUL
INTRAVENOUS | Status: AC
  Filled 2013-07-26: qty 20

## 2013-07-26 MED ORDER — CEFAZOLIN SODIUM-DEXTROSE 2-3 GM-% IV SOLR
INTRAVENOUS | Status: AC
  Administered 2013-07-26: 2 g via INTRAVENOUS
  Filled 2013-07-26: qty 50

## 2013-07-26 MED ORDER — HYDROCHLOROTHIAZIDE 12.5 MG PO CAPS
12.5000 mg | ORAL_CAPSULE | Freq: Every day | ORAL | Status: DC
  Administered 2013-07-27: 12.5 mg via ORAL
  Filled 2013-07-26: qty 1

## 2013-07-26 MED ORDER — INSULIN ASPART 100 UNIT/ML ~~LOC~~ SOLN
SUBCUTANEOUS | Status: AC
  Administered 2013-07-26: 4 [IU]
  Filled 2013-07-26: qty 1

## 2013-07-26 MED ORDER — ONDANSETRON HCL 4 MG/2ML IJ SOLN: 4.0000 mg | Freq: Four times a day (QID) | INTRAMUSCULAR | Status: DC | PRN

## 2013-07-26 MED ORDER — FENTANYL CITRATE 0.05 MG/ML IJ SOLN
INTRAMUSCULAR | Status: DC | PRN
  Administered 2013-07-26 (×3): 50 ug via INTRAVENOUS
  Administered 2013-07-26: 100 ug via INTRAVENOUS

## 2013-07-26 MED ORDER — PANTOPRAZOLE SODIUM 40 MG PO TBEC
40.0000 mg | DELAYED_RELEASE_TABLET | Freq: Every day | ORAL | Status: DC
  Administered 2013-07-26 – 2013-07-27 (×2): 40 mg via ORAL
  Filled 2013-07-26 (×2): qty 1

## 2013-07-26 MED ORDER — HYDROMORPHONE HCL PF 1 MG/ML IJ SOLN
INTRAMUSCULAR | Status: AC
  Filled 2013-07-26: qty 1

## 2013-07-26 MED ORDER — KETOROLAC TROMETHAMINE 30 MG/ML IJ SOLN
INTRAMUSCULAR | Status: AC
  Filled 2013-07-26: qty 1

## 2013-07-26 MED ORDER — SAXAGLIPTIN-METFORMIN ER 2.5-1000 MG PO TB24: 2.0000 | ORAL_TABLET | Freq: Every day | ORAL | Status: DC

## 2013-07-26 MED ORDER — NALOXONE HCL 0.4 MG/ML IJ SOLN: 0.4000 mg | INTRAMUSCULAR | Status: DC | PRN

## 2013-07-26 MED ORDER — ACETAMINOPHEN 325 MG PO TABS: 650.0000 mg | ORAL_TABLET | ORAL | Status: DC | PRN

## 2013-07-26 MED ORDER — NEOSTIGMINE METHYLSULFATE 1 MG/ML IJ SOLN
INTRAMUSCULAR | Status: AC
  Filled 2013-07-26: qty 1

## 2013-07-26 MED ORDER — BUPIVACAINE HCL (PF) 0.25 % IJ SOLN
INTRAMUSCULAR | Status: AC
  Filled 2013-07-26: qty 30

## 2013-07-26 MED ORDER — KETOROLAC TROMETHAMINE 30 MG/ML IJ SOLN: 15.0000 mg | Freq: Once | INTRAMUSCULAR | Status: DC | PRN

## 2013-07-26 MED ORDER — LACTATED RINGERS IV SOLN
INTRAVENOUS | Status: DC
  Administered 2013-07-26: 1000 mL via INTRAVENOUS
  Administered 2013-07-26 (×2): via INTRAVENOUS

## 2013-07-26 MED ORDER — PROPOFOL 10 MG/ML IV BOLUS
INTRAVENOUS | Status: DC | PRN
  Administered 2013-07-26: 130 mg via INTRAVENOUS

## 2013-07-26 MED ORDER — LOSARTAN POTASSIUM-HCTZ 100-12.5 MG PO TABS: 1.0000 | ORAL_TABLET | Freq: Every day | ORAL | Status: DC

## 2013-07-26 MED ORDER — ONDANSETRON HCL 4 MG/2ML IJ SOLN
INTRAMUSCULAR | Status: AC
  Filled 2013-07-26: qty 2

## 2013-07-26 MED ORDER — LIDOCAINE HCL (CARDIAC) 20 MG/ML IV SOLN
INTRAVENOUS | Status: AC
  Filled 2013-07-26: qty 5

## 2013-07-26 MED ORDER — ONDANSETRON HCL 4 MG/2ML IJ SOLN
INTRAMUSCULAR | Status: DC | PRN
  Administered 2013-07-26: 4 mg via INTRAVENOUS

## 2013-07-26 MED ORDER — GLYCOPYRROLATE 0.2 MG/ML IJ SOLN
INTRAMUSCULAR | Status: DC | PRN
  Administered 2013-07-26: 0.4 mg via INTRAVENOUS

## 2013-07-26 MED ORDER — NEOSTIGMINE METHYLSULFATE 1 MG/ML IJ SOLN
INTRAMUSCULAR | Status: DC | PRN
  Administered 2013-07-26: 2 mg via INTRAVENOUS

## 2013-07-26 MED ORDER — HYDROMORPHONE HCL PF 1 MG/ML IJ SOLN: 0.2500 mg | INTRAMUSCULAR | Status: DC | PRN

## 2013-07-26 MED ORDER — ZOLPIDEM TARTRATE 5 MG PO TABS
5.0000 mg | ORAL_TABLET | Freq: Every evening | ORAL | Status: DC | PRN
  Administered 2013-07-26: 5 mg via ORAL
  Filled 2013-07-26: qty 1

## 2013-07-26 MED ORDER — ROCURONIUM BROMIDE 50 MG/5ML IV SOLN
INTRAVENOUS | Status: AC
  Filled 2013-07-26: qty 1

## 2013-07-26 MED ORDER — LACTATED RINGERS IR SOLN
Status: DC | PRN
  Administered 2013-07-26: 3000 mL

## 2013-07-26 MED ORDER — HYDROMORPHONE HCL PF 1 MG/ML IJ SOLN
INTRAMUSCULAR | Status: DC | PRN
  Administered 2013-07-26: 1 mg via INTRAVENOUS

## 2013-07-26 MED ORDER — FENTANYL CITRATE 0.05 MG/ML IJ SOLN
INTRAMUSCULAR | Status: AC
  Filled 2013-07-26: qty 5

## 2013-07-26 MED ORDER — OXYCODONE-ACETAMINOPHEN 5-325 MG PO TABS
1.0000 | ORAL_TABLET | ORAL | Status: DC | PRN
  Administered 2013-07-27: 1 via ORAL
  Filled 2013-07-26: qty 1

## 2013-07-26 MED ORDER — AMLODIPINE BESYLATE 5 MG PO TABS
5.0000 mg | ORAL_TABLET | Freq: Every day | ORAL | Status: DC
  Administered 2013-07-27: 5 mg via ORAL
  Filled 2013-07-26: qty 1

## 2013-07-26 MED ORDER — GLYCOPYRROLATE 0.2 MG/ML IJ SOLN
INTRAMUSCULAR | Status: AC
  Filled 2013-07-26: qty 2

## 2013-07-26 MED ORDER — ATOMOXETINE HCL 80 MG PO CAPS: 80.0000 mg | ORAL_CAPSULE | Freq: Every day | ORAL | Status: DC

## 2013-07-26 SURGICAL SUPPLY — 48 items
ADH SKN CLS APL DERMABOND .7 (GAUZE/BANDAGES/DRESSINGS) ×1
ADH SKN CLS LQ APL DERMABOND (GAUZE/BANDAGES/DRESSINGS) ×1
CABLE HIGH FREQUENCY MONO STRZ (ELECTRODE) IMPLANT
CATH ROBINSON RED A/P 16FR (CATHETERS) ×2 IMPLANT
CLOTH BEACON ORANGE TIMEOUT ST (SAFETY) ×2 IMPLANT
CONT PATH 16OZ SNAP LID 3702 (MISCELLANEOUS) ×2 IMPLANT
COVER TABLE BACK 60X90 (DRAPES) ×2 IMPLANT
DECANTER SPIKE VIAL GLASS SM (MISCELLANEOUS) IMPLANT
DERMABOND ADHESIVE PROPEN (GAUZE/BANDAGES/DRESSINGS) ×1
DERMABOND ADVANCED (GAUZE/BANDAGES/DRESSINGS) ×1
DERMABOND ADVANCED .7 DNX12 (GAUZE/BANDAGES/DRESSINGS) ×1 IMPLANT
DERMABOND ADVANCED .7 DNX6 (GAUZE/BANDAGES/DRESSINGS) IMPLANT
ELECT REM PT RETURN 9FT ADLT (ELECTROSURGICAL)
ELECTRODE REM PT RTRN 9FT ADLT (ELECTROSURGICAL) IMPLANT
EVACUATOR SMOKE 8.L (FILTER) ×1 IMPLANT
GLOVE BIO SURGEON STRL SZ 6 (GLOVE) ×1 IMPLANT
GLOVE BIO SURGEON STRL SZ7 (GLOVE) ×4 IMPLANT
GLOVE BIOGEL PI IND STRL 6.5 (GLOVE) ×1 IMPLANT
GLOVE BIOGEL PI INDICATOR 6.5 (GLOVE) ×1
GLOVE INDICATOR 6.0 STRL GRN (GLOVE) ×1 IMPLANT
GLOVE INDICATOR 6.5 STRL GRN (GLOVE) ×1 IMPLANT
GLOVE INDICATOR 7.0 STRL GRN (GLOVE) ×3 IMPLANT
GLOVE NEODERM STER SZ 7 (GLOVE) ×1 IMPLANT
GOWN STRL REIN XL XLG (GOWN DISPOSABLE) ×8 IMPLANT
NEEDLE INSUFFLATION 120MM (ENDOMECHANICALS) ×1 IMPLANT
NS IRRIG 1000ML POUR BTL (IV SOLUTION) ×2 IMPLANT
PACK LAVH (CUSTOM PROCEDURE TRAY) ×2 IMPLANT
PROTECTOR NERVE ULNAR (MISCELLANEOUS) ×2 IMPLANT
SEALER TISSUE G2 CVD JAW 45CM (ENDOMECHANICALS) ×2 IMPLANT
SET CYSTO W/LG BORE CLAMP LF (SET/KITS/TRAYS/PACK) IMPLANT
SET IRRIG TUBING LAPAROSCOPIC (IRRIGATION / IRRIGATOR) IMPLANT
STRIP CLOSURE SKIN 1/4X3 (GAUZE/BANDAGES/DRESSINGS) IMPLANT
SUT MON AB 2-0 CT1 27 (SUTURE) ×4 IMPLANT
SUT VIC AB 0 CT1 18XCR BRD8 (SUTURE) ×2 IMPLANT
SUT VIC AB 0 CT1 27 (SUTURE) ×2
SUT VIC AB 0 CT1 27XBRD ANBCTR (SUTURE) ×1 IMPLANT
SUT VIC AB 0 CT1 36 (SUTURE) ×2 IMPLANT
SUT VIC AB 0 CT1 8-18 (SUTURE) ×4
SUT VICRYL 0 UR6 27IN ABS (SUTURE) IMPLANT
SUT VICRYL 1 TIES 12X18 (SUTURE) ×2 IMPLANT
SUT VICRYL 4-0 PS2 18IN ABS (SUTURE) ×2 IMPLANT
TOWEL OR 17X24 6PK STRL BLUE (TOWEL DISPOSABLE) ×4 IMPLANT
TRAY FOLEY CATH 14FR (SET/KITS/TRAYS/PACK) ×2 IMPLANT
TROCAR BALLN 12MMX100 BLUNT (TROCAR) IMPLANT
TROCAR OPTI TIP 5M 100M (ENDOMECHANICALS) ×2 IMPLANT
TROCAR XCEL DIL TIP R 11M (ENDOMECHANICALS) ×1 IMPLANT
WARMER LAPAROSCOPE (MISCELLANEOUS) ×2 IMPLANT
WATER STERILE IRR 1000ML POUR (IV SOLUTION) ×2 IMPLANT

## 2013-07-26 NOTE — Anesthesia Postprocedure Evaluation (Signed)
  Anesthesia Post-op Note  Anesthesia Post Note  Patient: Chelsea Espinoza  Procedure(s) Performed: Procedure(s) (LRB): LAPAROSCOPIC ASSISTED VAGINAL HYSTERECTOMY (N/A)  Anesthesia type: General  Patient location: PACU  Post pain: Pain level controlled  Post assessment: Post-op Vital signs reviewed  Last Vitals:  Filed Vitals:   07/26/13 0945  BP:   Pulse:   Temp:   Resp: 16    Post vital signs: Reviewed  Level of consciousness: sedated  Complications: No apparent anesthesia complications

## 2013-07-26 NOTE — H&P (Signed)
  History and physical exam unchanged 

## 2013-07-26 NOTE — Anesthesia Postprocedure Evaluation (Signed)
  Anesthesia Post-op Note  Patient: Chelsea Espinoza  Procedure(s) Performed: Procedure(s): LAPAROSCOPIC ASSISTED VAGINAL HYSTERECTOMY (N/A)  Patient Location: PACU and Mother/Baby  Anesthesia Type:General  Level of Consciousness: awake, alert , oriented and patient cooperative  Airway and Oxygen Therapy: Patient Spontanous Breathing  Post-op Pain: none  Post-op Assessment: Post-op Vital signs reviewed, Patient's Cardiovascular Status Stable and Respiratory Function Stable  Post-op Vital Signs: Reviewed and stable  Complications: No apparent anesthesia complications

## 2013-07-26 NOTE — Op Note (Signed)
Chelsea Espinoza, Chelsea Espinoza                  ACCOUNT NO.:  [id number]  MEDICAL RECORD NO.:  [id number]  LOCATION:  [facility]                          FACILITY:  [facility]  PHYSICIAN:  Juluis Mire, M.D.   DATE OF BIRTH:  11/01/1961  DATE OF PROCEDURE:  07/26/2013 DATE OF DISCHARGE:                              OPERATIVE REPORT   PREOPERATIVE DIAGNOSIS:  Uterine fibroids with associated menorrhagia and severe anemia.  POSTOPERATIVE DIAGNOSIS:  Uterine fibroids with associated menorrhagia and severe anemia.  OPERATIVE PROCEDURE:  Laparoscopic-assisted vaginal hysterectomy with removal of both fallopian tubes.  SURGEON:  Juluis Mire, MD  ASSISTANT:  Mitchel Honour.  ANESTHESIA:  General endotracheal.  BLOOD LOSS:  Approximately 350 mL.  PACKS:  None.  DRAINS:  Urethral Foley.  INTRAOPERATIVE BLOOD PLACED:  None.  COMPLICATIONS:  None.  INDICATIONS:  Are as dictated in history and physical.  PROCEDURE:  The patient was taken to the OR, placed in supine position. After satisfactory level of general endotracheal anesthesia obtained, the patient was placed in the dorsal lithotomy position using the Allen stirrups.  The abdomen, perineum, and vagina were prepped out with Betadine.  Bladder was emptied by in and out catheterization.  The Hulka tenaculum was put in place and secured.  The patient was then draped in sterile field.  A subumbilical incision was made with a knife.  Veress needle was introduced in abdominal cavity without difficulty.  Abdomen was inflated with approximately 3 liters of carbon dioxide.  A 10/11 trocar was inserted.  Laparoscope was introduced.  There was no evidence of injury to adjacent organs.  Upper abdomen including the liver tip and the gallbladder were clear.  Both lateral gutters were clear.  The appendix was retrocecal.  A 5 mm trocar was put in place under direct visualization of suprapubic area.  Uterus was enlarged with multiple fibroids.  Tubes and  ovaries were unremarkable.  Cul-de-sac was clear. I first went to the right side.  The right tube was elevated.  Using the EnSeal, we cauterized and incised the mesenteric attachments of the tube up to the round ligament.  We then identified the utero-ovarian pedicle. Using the EnSeal, this was cauterized incised freeing the ovary from the uterus.  Next, the right round ligament was cauterized and incised. Tubes remained attached.  Then we went to the left side.  Again, the left tube was elevated.  Using the EnSeal, we cauterized and incised the mesenteric attachments of the tube up to the uterus.  Next, we identified the ovarian vasculature.  Using the EnSeal, this was cauterized and incised detaching the ovary from the uterus.  Next, the left round ligament was cauterized and incised.  At this point in time, we decided to go vaginal.  Laparoscope was removed.  That was deflated with carbon dioxide.  Hulka tenaculum was taken out.  The patient's legs were repositioned.  A weighted speculum was placed in vaginal vault.  Cervix grasped with Christella Hartigan tenaculum.  Cul-de-sac was entered sharply.  Both uterosacral ligaments were clamped, cut, and suture ligated with 0 Vicryl.  The reflection of vaginal mucosa anteriorly was incised using the Bovie. The bladder was  dissected superiorly.  Paracervical tissue was clamped, cut, and suture ligated with 0 Vicryl.  Vesicouterine space was identified and entered.  Retractor was put in place.  Using the clamp, cut, and tie technique with suture ligature of 0 Vicryl, we serially separated the parametrium from the side of the uterus.  We got to the point where we had to begin morcellation.  First we excised the cervix and lower uterine stump.  We continued the process of morcellating until we could deliver the uterine fundus.  Remaining pedicles were clamped and cut and the uterus and both fallopian tubes were passed off the operative field and sent to  Pathology.  Held pedicles were secured with suture ligatures and free ties of 0 Vicryl.  We then ran the posterior vaginal cuff with a running lock suture of 0 Vicryl.  Vaginal mucosa was reapproximated with interrupted figure-of-eights of 2-0 Monocryl.  At this point in time, Foley was placed to straight drain.  Clear urine was noted.  The patient's legs were repositioned.  Abdomen was reinflated with carbon dioxide.  Laparoscope was reintroduced.  Did have some oozing from the vaginal cuff, brought under control with the bipolar.  We thoroughly irrigated the pelvis, had good hemostasis at the cuff and both ovaries.  The abdomen was deflated with carbon dioxide.  All trocars were removed.  Subumbilical incision closed with interrupted subcuticular 4-0 Vicryl.  Suprapubic incision was closed with Dermabond. Sponge, instrument, and needle count was reported correct by circulating nurse x2.  The patient when extubated and alert was transferred to recovery room in good condition.  Urine output remained clear and adequate.     Juluis Mire, M.D.     JSM/MEDQ  D:  07/26/2013  T:  07/26/2013  Job:  [id number]

## 2013-07-26 NOTE — Transfer of Care (Signed)
Immediate Anesthesia Transfer of Care Note  Patient: Chelsea Espinoza  Procedure(s) Performed: Procedure(s): LAPAROSCOPIC ASSISTED VAGINAL HYSTERECTOMY (N/A)  Patient Location: PACU  Anesthesia Type:General  Level of Consciousness: sedated  Airway & Oxygen Therapy: Patient Spontanous Breathing and Patient connected to nasal cannula oxygen  Post-op Assessment: Report given to PACU RN and Post -op Vital signs reviewed and stable  Post vital signs: stable  Complications: No apparent anesthesia complications

## 2013-07-26 NOTE — Progress Notes (Signed)
Patient ID: Chelsea Espinoza, female   DOB: 19-Mar-1961, 52 y.o.   MRN: [id number] AF VSS ABD SOFT GOOD UO MINIMAL BLEEDING

## 2013-07-26 NOTE — Brief Op Note (Signed)
07/26/2013  9:10 AM  PATIENT:  Chelsea Espinoza  52 y.o. female  PRE-OPERATIVE DIAGNOSIS:  fibroids  POST-OPERATIVE DIAGNOSIS:  uterine fibroids  PROCEDURE:  Procedure(s): LAPAROSCOPIC ASSISTED VAGINAL HYSTERECTOMY (N/A)  SURGEON:  Surgeon(s) and Role:    * Juluis Mire, MD - Primary    * Mitchel Honour, DO - Assisting  PHYSICIAN ASSISTANT:   ASSISTANTS: morris   ANESTHESIA:   local and general  EBL:  Total I/O In: 1000 [I.V.:1000] Out: 350 [Urine:100; Blood:250]  BLOOD ADMINISTERED:none  DRAINS: Urinary Catheter (Foley)   LOCAL MEDICATIONS USED:  MARCAINE     SPECIMEN:  Source of Specimen:  uterus and bilateral fallopian tubes  DISPOSITION OF SPECIMEN:  PATHOLOGY  COUNTS:  YES  TOURNIQUET:  * No tourniquets in log *  DICTATION: .Other Dictation: Dictation Number [id number]  PLAN OF CARE: Admit for overnight observation  PATIENT DISPOSITION:  PACU - hemodynamically stable.   Delay start of Pharmacological VTE agent (>24hrs) due to surgical blood loss or risk of bleeding: no

## 2013-07-27 ENCOUNTER — Encounter (HOSPITAL_COMMUNITY): Payer: Self-pay | Admitting: Obstetrics and Gynecology

## 2013-07-27 DIAGNOSIS — D259 Leiomyoma of uterus, unspecified: Secondary | ICD-10-CM | POA: Diagnosis present

## 2013-07-27 DIAGNOSIS — Z90710 Acquired absence of both cervix and uterus: Secondary | ICD-10-CM | POA: Clinically undetermined

## 2013-07-27 LAB — CBC
HCT: 37.4 % (ref 36.0–46.0)
Hemoglobin: 12.5 g/dL (ref 12.0–15.0)
MCH: 30.3 pg (ref 26.0–34.0)
MCHC: 33.4 g/dL (ref 30.0–36.0)
RBC: 4.13 MIL/uL (ref 3.87–5.11)

## 2013-07-27 MED ORDER — OXYCODONE-ACETAMINOPHEN 7.5-325 MG PO TABS: 1.0000 | ORAL_TABLET | ORAL | Status: DC | PRN

## 2013-07-27 NOTE — Discharge Summary (Signed)
  Patient name  Chelsea Espinoza, Chelsea Espinoza DICTATION#[id number] CSN# [id number]  Juluis Mire, MD 07/27/2013 7:39 AM

## 2013-07-27 NOTE — Progress Notes (Signed)
1 Day Post-Op Procedure(s) (LRB): LAPAROSCOPIC ASSISTED VAGINAL HYSTERECTOMY (N/A)  Subjective: Patient reports incisional pain and tolerating PO.    Objective: I have reviewed patient's vital signs and labs.  General: alert GI: soft, non-tender; bowel sounds normal; no masses,  no organomegaly and incision: clean Vaginal Bleeding: minimal  Assessment: s/p Procedure(s): LAPAROSCOPIC ASSISTED VAGINAL HYSTERECTOMY (N/A): stable  Plan: Discharge home  LOS: 1 day    Tatyana Biber S 07/27/2013, 7:34 AM

## 2013-07-27 NOTE — Progress Notes (Signed)
Ambulated patient to exit. Left with Husband, discharge instructions, and pain med prescription. Patient stated that her pain was a 1 from soreness.

## 2013-07-27 NOTE — Discharge Summary (Signed)
Chelsea Espinoza, Chelsea Espinoza                  ACCOUNT NO.:  [id number]  MEDICAL RECORD NO.:  [id number]  LOCATION:  9319                          FACILITY:  [facility]  PHYSICIAN:  Juluis Mire, M.D.   DATE OF BIRTH:  1961-05-19  DATE OF ADMISSION:  07/26/2013 DATE OF DISCHARGE:  07/27/2013                              DISCHARGE SUMMARY   ADMITTING DIAGNOSES:  Abnormal uterine bleeding with associated uterine fibroids leading to severe anemia.  DISCHARGE DIAGNOSES:  Abnormal uterine bleeding with associated uterine fibroids leading to severe anemia.  PROCEDURE:  Laparoscopic-assisted vaginal hysterectomy with removal of both fallopian tubes.  For complete history and physical, please see dictated note.  COURSE IN THE HOSPITAL:  Patient underwent above-noted surgery. Pathology is pending.  Postop, did well, was discharged home on first postop day, at that time afebrile, stable vital signs.  Abdomen was soft.  Bowel sounds were active.  All incisions were clear.  Bleeding was minimal.  Foley had just been discontinued and CBC is pending.  COMPLICATIONS:  In terms of complications, none was encountered during the stay in the hospital.  The patient was discharged home in stable condition.  DISPOSITION:  The patient to avoid heavy lifting, vaginal entrance, or driving a car.  She was instructed to call should there be signs of infection, increasing nausea and vomiting.  Active vaginal bleeding. Severe abdominal or pelvic pain.  Signs and symptoms of deep venous thrombosis and pulmonary embolus.  Discharged home on Percocet, she needs for pain.  Our office will call her on Monday to check on arrange for follow up.     Juluis Mire, M.D.     JSM/MEDQ  D:  07/27/2013  T:  07/27/2013  Job:  [id number]

## 2013-09-19 ENCOUNTER — Telehealth: Payer: Self-pay | Admitting: Internal Medicine

## 2013-09-19 NOTE — Telephone Encounter (Signed)
returned pt call and r/s est appt o 12.8.14.Marland KitchenMarland Kitchenpt ok and awre

## 2013-10-09 ENCOUNTER — Other Ambulatory Visit (HOSPITAL_BASED_OUTPATIENT_CLINIC_OR_DEPARTMENT_OTHER): Payer: 59

## 2013-10-09 DIAGNOSIS — D539 Nutritional anemia, unspecified: Secondary | ICD-10-CM

## 2013-10-09 LAB — CBC WITH DIFFERENTIAL/PLATELET
BASO%: 0.8 % (ref 0.0–2.0)
Basophils Absolute: 0 10*3/uL (ref 0.0–0.1)
EOS%: 3.4 % (ref 0.0–7.0)
HGB: 14.5 g/dL (ref 11.6–15.9)
MCH: 31 pg (ref 25.1–34.0)
MCHC: 33.2 g/dL (ref 31.5–36.0)
MCV: 93.3 fL (ref 79.5–101.0)
MONO%: 7.5 % (ref 0.0–14.0)
RBC: 4.69 10*6/uL (ref 3.70–5.45)
RDW: 12.3 % (ref 11.2–14.5)
lymph#: 0.9 10*3/uL (ref 0.9–3.3)

## 2013-10-09 LAB — FERRITIN CHCC: Ferritin: 119 ng/ml (ref 9–269)

## 2013-10-09 LAB — IRON AND TIBC CHCC
Iron: 119 ug/dL (ref 41–142)
TIBC: 321 ug/dL (ref 236–444)

## 2013-10-11 ENCOUNTER — Ambulatory Visit: Payer: 59 | Admitting: Internal Medicine

## 2013-10-15 ENCOUNTER — Ambulatory Visit (HOSPITAL_BASED_OUTPATIENT_CLINIC_OR_DEPARTMENT_OTHER): Payer: 59 | Admitting: Internal Medicine

## 2013-10-15 ENCOUNTER — Encounter (INDEPENDENT_AMBULATORY_CARE_PROVIDER_SITE_OTHER): Payer: Self-pay

## 2013-10-15 ENCOUNTER — Encounter: Payer: Self-pay | Admitting: Internal Medicine

## 2013-10-15 VITALS — BP 141/79 | HR 97 | Temp 98.0°F | Resp 18 | Ht 67.0 in | Wt 135.6 lb

## 2013-10-15 DIAGNOSIS — D509 Iron deficiency anemia, unspecified: Secondary | ICD-10-CM

## 2013-10-15 DIAGNOSIS — D539 Nutritional anemia, unspecified: Secondary | ICD-10-CM

## 2013-10-15 NOTE — Progress Notes (Signed)
[facility] Telephone:(336) [phone]   Fax:(336) [phone]  OFFICE PROGRESS NOTE  [name], MD [address], [address] [place] [address]  DIAGNOSIS: Iron deficiency anemia secondary to menorrhagia. Resolved after hysterectomy.  PRIOR THERAPY: Feraheme infusion on as-needed basis last dose was given on 06/08/2012.   CURRENT THERAPY: Poly-iron 150 mg by mouth daily.   INTERVAL HISTORY: Chelsea Espinoza 52 y.o. female returns to the clinic today for followup visit accompanied her husband. The patient is feeling fine today with no specific complaints. Her energy level is much better. She she recently underwent hysterectomy on 07/26/2013 by Dr. Arelia Sneddon. She denied having any significant dizzy spells. The patient denied having any bleeding issues. She denied having any chest pain, shortness breath, cough or hemoptysis. She had repeat CBC and iron study and she is here for evaluation and discussion of her lab results. Labwork on 10/09/2013 showed ferritin 119, serum iron 119, total iron binding capacity 321 and iron saturation 37%.  MEDICAL HISTORY: Past Medical History  Diagnosis Date  . Hypertension   . Hyperlipidemia   . GERD (gastroesophageal reflux disease)   . Diabetes mellitus     type 2    ALLERGIES:  is allergic to accupril.  MEDICATIONS:  Current Outpatient Prescriptions  Medication Sig Dispense Refill  . amLODipine (NORVASC) 5 MG tablet Take 5 mg by mouth daily.      Marland Kitchen aspirin 325 MG tablet Take 81 mg by mouth daily.       Marland Kitchen atomoxetine (STRATTERA) 80 MG capsule Take 80 mg by mouth daily.        Marland Kitchen atorvastatin (LIPITOR) 40 MG tablet Take 40 mg by mouth daily.        . Biotin 5000 MCG CAPS Take 1 capsule by mouth daily.      Marland Kitchen losartan-hydrochlorothiazide (HYZAAR) 100-12.5 MG per tablet Take 1 tablet by mouth daily.      Marland Kitchen omeprazole (PRILOSEC) 20 MG capsule Take 20 mg by mouth as needed.       . Polysaccharide Iron Complex (POLY-IRON  150 PO) Take 1 tablet by mouth daily.       . Saxagliptin-Metformin (KOMBIGLYZE XR) 2.03-999 MG TB24 Take 2 tablets by mouth daily after breakfast.        No current facility-administered medications for this visit.    SURGICAL HISTORY:  Past Surgical History  Procedure Laterality Date  . Cryoablation    . No past surgeries    . Abdominal hysterectomy    . Laparoscopic assisted vaginal hysterectomy N/A 07/26/2013    Procedure: LAPAROSCOPIC ASSISTED VAGINAL HYSTERECTOMY;  Surgeon: Juluis Mire, MD;  Location: [facility];  Service: Gynecology;  Laterality: N/A;    REVIEW OF SYSTEMS:  A comprehensive review of systems was negative.   PHYSICAL EXAMINATION: General appearance: alert, cooperative and no distress Head: Normocephalic, without obvious abnormality, atraumatic Neck: no adenopathy Lymph nodes: Cervical, supraclavicular, and axillary nodes normal. Resp: clear to auscultation bilaterally Cardio: regular rate and rhythm, S1, S2 normal, no murmur, click, rub or gallop GI: soft, non-tender; bowel sounds normal; no masses,  no organomegaly Extremities: extremities normal, atraumatic, no cyanosis or edema  ECOG PERFORMANCE STATUS: 0 - Asymptomatic  Blood pressure 141/79, pulse 97, temperature 98 F (36.7 C), temperature source Oral, resp. rate 18, height 5\' 7"  (1.702 m), weight 135 lb 9.6 oz (61.508 kg), SpO2 100.00%.  LABORATORY DATA: Lab Results  Component Value Date   WBC 6.0 10/09/2013  HGB 14.5 10/09/2013   HCT 43.7 10/09/2013   MCV 93.3 10/09/2013   PLT 368 10/09/2013      Chemistry      Component Value Date/Time   NA 136 07/18/2013 1040   NA 139 01/12/2013 0937   K 3.5 07/18/2013 1040   K 3.9 01/12/2013 0937   CL 97 07/18/2013 1040   CL 104 01/12/2013 0937   CO2 31 07/18/2013 1040   CO2 24 01/12/2013 0937   BUN 12 07/18/2013 1040   BUN 12.7 01/12/2013 0937   CREATININE 0.70 07/18/2013 1040   CREATININE 0.9 01/12/2013 0937      Component Value Date/Time   CALCIUM 9.8 07/18/2013  1040   CALCIUM 9.4 01/12/2013 0937   ALKPHOS 86 04/06/2012 1126   AST 14 04/06/2012 1126   ALT 11 04/06/2012 1126   BILITOT 0.4 04/06/2012 1126       RADIOGRAPHIC STUDIES:   ASSESSMENT AND PLAN: This is a very pleasant 52 years old white female with history of iron deficiency anemia status post Feraheme infusion and currently on oral tablets. The patient is feeling fine and she has no significant evidence for anemia or iron deficiency. Her iron deficiency anemia completely resolved after the recent hysterectomy. I recommended for the patient to take oral tablet on as-needed basis. She will followup with her primary care physician. I would see the patient on as-needed basis. She was advised to call immediately if she has any concerning symptoms in the interval. The patient voices understanding of current disease status and treatment options and is in agreement with the current care plan.  All questions were answered. The patient knows to call the clinic with any problems, questions or concerns. We can certainly see the patient much sooner if necessary.

## 2013-10-15 NOTE — Patient Instructions (Signed)
Your iron deficiency anemia has completely resolved. Followup visit with your primary care physician. Followup visit with me on as-needed basis.

## 2013-12-26 ENCOUNTER — Other Ambulatory Visit: Payer: Self-pay | Admitting: Dermatology

## 2015-02-14 ENCOUNTER — Other Ambulatory Visit: Payer: Self-pay | Admitting: Dermatology

## 2017-02-16 DIAGNOSIS — R52 Pain, unspecified: Secondary | ICD-10-CM | POA: Diagnosis not present

## 2017-02-16 DIAGNOSIS — M180 Bilateral primary osteoarthritis of first carpometacarpal joints: Secondary | ICD-10-CM | POA: Diagnosis not present

## 2017-02-16 DIAGNOSIS — M79641 Pain in right hand: Secondary | ICD-10-CM | POA: Diagnosis not present

## 2017-02-16 DIAGNOSIS — M654 Radial styloid tenosynovitis [de Quervain]: Secondary | ICD-10-CM | POA: Diagnosis not present

## 2017-02-16 DIAGNOSIS — M79642 Pain in left hand: Secondary | ICD-10-CM | POA: Diagnosis not present

## 2017-02-18 DIAGNOSIS — M25531 Pain in right wrist: Secondary | ICD-10-CM | POA: Diagnosis not present

## 2017-02-23 DIAGNOSIS — E119 Type 2 diabetes mellitus without complications: Secondary | ICD-10-CM | POA: Diagnosis not present

## 2017-02-23 DIAGNOSIS — E7800 Pure hypercholesterolemia, unspecified: Secondary | ICD-10-CM | POA: Diagnosis not present

## 2017-02-23 DIAGNOSIS — I10 Essential (primary) hypertension: Secondary | ICD-10-CM | POA: Diagnosis not present

## 2017-03-18 DIAGNOSIS — M180 Bilateral primary osteoarthritis of first carpometacarpal joints: Secondary | ICD-10-CM | POA: Diagnosis not present

## 2017-05-19 DIAGNOSIS — Z01419 Encounter for gynecological examination (general) (routine) without abnormal findings: Secondary | ICD-10-CM | POA: Diagnosis not present

## 2017-05-19 DIAGNOSIS — Z6821 Body mass index (BMI) 21.0-21.9, adult: Secondary | ICD-10-CM | POA: Diagnosis not present

## 2017-05-19 DIAGNOSIS — Z1231 Encounter for screening mammogram for malignant neoplasm of breast: Secondary | ICD-10-CM | POA: Diagnosis not present

## 2017-07-13 DIAGNOSIS — L821 Other seborrheic keratosis: Secondary | ICD-10-CM | POA: Diagnosis not present

## 2017-07-13 DIAGNOSIS — D224 Melanocytic nevi of scalp and neck: Secondary | ICD-10-CM | POA: Diagnosis not present

## 2017-07-13 DIAGNOSIS — D2239 Melanocytic nevi of other parts of face: Secondary | ICD-10-CM | POA: Diagnosis not present

## 2017-08-23 DIAGNOSIS — J069 Acute upper respiratory infection, unspecified: Secondary | ICD-10-CM | POA: Diagnosis not present

## 2017-08-23 DIAGNOSIS — B9789 Other viral agents as the cause of diseases classified elsewhere: Secondary | ICD-10-CM | POA: Diagnosis not present

## 2017-09-19 DIAGNOSIS — E7800 Pure hypercholesterolemia, unspecified: Secondary | ICD-10-CM | POA: Diagnosis not present

## 2017-09-19 DIAGNOSIS — Z1159 Encounter for screening for other viral diseases: Secondary | ICD-10-CM | POA: Diagnosis not present

## 2017-09-19 DIAGNOSIS — I10 Essential (primary) hypertension: Secondary | ICD-10-CM | POA: Diagnosis not present

## 2017-09-19 DIAGNOSIS — E1165 Type 2 diabetes mellitus with hyperglycemia: Secondary | ICD-10-CM | POA: Diagnosis not present

## 2018-01-03 DIAGNOSIS — N390 Urinary tract infection, site not specified: Secondary | ICD-10-CM | POA: Diagnosis not present

## 2018-01-03 DIAGNOSIS — E119 Type 2 diabetes mellitus without complications: Secondary | ICD-10-CM | POA: Diagnosis not present

## 2018-01-03 DIAGNOSIS — I10 Essential (primary) hypertension: Secondary | ICD-10-CM | POA: Diagnosis not present

## 2018-03-07 DIAGNOSIS — E1165 Type 2 diabetes mellitus with hyperglycemia: Secondary | ICD-10-CM | POA: Diagnosis not present

## 2018-03-07 DIAGNOSIS — I10 Essential (primary) hypertension: Secondary | ICD-10-CM | POA: Diagnosis not present

## 2018-03-07 DIAGNOSIS — Z0000 Encounter for general adult medical examination without abnormal findings: Secondary | ICD-10-CM | POA: Diagnosis not present

## 2018-03-07 DIAGNOSIS — E7800 Pure hypercholesterolemia, unspecified: Secondary | ICD-10-CM | POA: Diagnosis not present

## 2018-06-14 DIAGNOSIS — Z6822 Body mass index (BMI) 22.0-22.9, adult: Secondary | ICD-10-CM | POA: Diagnosis not present

## 2018-06-14 DIAGNOSIS — Z01419 Encounter for gynecological examination (general) (routine) without abnormal findings: Secondary | ICD-10-CM | POA: Diagnosis not present

## 2018-06-14 DIAGNOSIS — Z1231 Encounter for screening mammogram for malignant neoplasm of breast: Secondary | ICD-10-CM | POA: Diagnosis not present

## 2018-06-14 DIAGNOSIS — Z13820 Encounter for screening for osteoporosis: Secondary | ICD-10-CM | POA: Diagnosis not present

## 2018-08-23 DIAGNOSIS — D2271 Melanocytic nevi of right lower limb, including hip: Secondary | ICD-10-CM | POA: Diagnosis not present

## 2018-08-23 DIAGNOSIS — D225 Melanocytic nevi of trunk: Secondary | ICD-10-CM | POA: Diagnosis not present

## 2018-08-23 DIAGNOSIS — D2239 Melanocytic nevi of other parts of face: Secondary | ICD-10-CM | POA: Diagnosis not present

## 2018-09-06 DIAGNOSIS — I10 Essential (primary) hypertension: Secondary | ICD-10-CM | POA: Diagnosis not present

## 2018-09-06 DIAGNOSIS — E1165 Type 2 diabetes mellitus with hyperglycemia: Secondary | ICD-10-CM | POA: Diagnosis not present

## 2018-09-06 DIAGNOSIS — Z23 Encounter for immunization: Secondary | ICD-10-CM | POA: Diagnosis not present

## 2018-09-06 DIAGNOSIS — E7800 Pure hypercholesterolemia, unspecified: Secondary | ICD-10-CM | POA: Diagnosis not present

## 2018-09-23 DIAGNOSIS — Z23 Encounter for immunization: Secondary | ICD-10-CM | POA: Diagnosis not present

## 2018-10-09 DIAGNOSIS — E119 Type 2 diabetes mellitus without complications: Secondary | ICD-10-CM | POA: Diagnosis not present

## 2018-10-09 DIAGNOSIS — H25013 Cortical age-related cataract, bilateral: Secondary | ICD-10-CM | POA: Diagnosis not present

## 2018-10-09 DIAGNOSIS — H2513 Age-related nuclear cataract, bilateral: Secondary | ICD-10-CM | POA: Diagnosis not present

## 2018-12-13 DIAGNOSIS — E1165 Type 2 diabetes mellitus with hyperglycemia: Secondary | ICD-10-CM | POA: Diagnosis not present

## 2019-03-02 DIAGNOSIS — Z23 Encounter for immunization: Secondary | ICD-10-CM | POA: Diagnosis not present

## 2019-03-14 DIAGNOSIS — I10 Essential (primary) hypertension: Secondary | ICD-10-CM | POA: Diagnosis not present

## 2019-03-14 DIAGNOSIS — E1165 Type 2 diabetes mellitus with hyperglycemia: Secondary | ICD-10-CM | POA: Diagnosis not present

## 2019-03-14 DIAGNOSIS — E7800 Pure hypercholesterolemia, unspecified: Secondary | ICD-10-CM | POA: Diagnosis not present

## 2019-03-21 DIAGNOSIS — I10 Essential (primary) hypertension: Secondary | ICD-10-CM | POA: Diagnosis not present

## 2019-03-21 DIAGNOSIS — E1165 Type 2 diabetes mellitus with hyperglycemia: Secondary | ICD-10-CM | POA: Diagnosis not present

## 2019-03-21 DIAGNOSIS — E7800 Pure hypercholesterolemia, unspecified: Secondary | ICD-10-CM | POA: Diagnosis not present

## 2019-07-17 ENCOUNTER — Other Ambulatory Visit: Payer: Self-pay | Admitting: Obstetrics and Gynecology

## 2019-07-17 DIAGNOSIS — R928 Other abnormal and inconclusive findings on diagnostic imaging of breast: Secondary | ICD-10-CM

## 2019-07-20 ENCOUNTER — Ambulatory Visit
Admission: RE | Admit: 2019-07-20 | Discharge: 2019-07-20 | Disposition: A | Discharge status: Home / Self Care | Payer: 59 | Source: Ambulatory Visit | Attending: Obstetrics and Gynecology | Admitting: Obstetrics and Gynecology

## 2019-07-20 ENCOUNTER — Other Ambulatory Visit: Payer: Self-pay

## 2019-07-20 DIAGNOSIS — R928 Other abnormal and inconclusive findings on diagnostic imaging of breast: Secondary | ICD-10-CM

## 2019-09-13 ENCOUNTER — Other Ambulatory Visit: Payer: Self-pay

## 2019-09-13 DIAGNOSIS — Z20822 Contact with and (suspected) exposure to covid-19: Secondary | ICD-10-CM

## 2019-09-15 LAB — NOVEL CORONAVIRUS, NAA: SARS-CoV-2, NAA: NOT DETECTED

## 2020-01-13 ENCOUNTER — Ambulatory Visit: Payer: 59 | Attending: Internal Medicine

## 2020-01-13 DIAGNOSIS — Z23 Encounter for immunization: Secondary | ICD-10-CM | POA: Insufficient documentation

## 2020-01-13 NOTE — Progress Notes (Signed)
   Covid-19 Vaccination Clinic  Name:  Chelsea Espinoza    MRN: [id number] DOB: [birth year]  01/13/2020  Chelsea Espinoza was observed post Covid-19 immunization for 15 minutes without incident. She was provided with Vaccine Information Sheet and instruction to access the V-Safe system.   Chelsea Espinoza was instructed to call 911 with any severe reactions post vaccine: Marland Kitchen Difficulty breathing  . Swelling of face and throat  . A fast heartbeat  . A bad rash all over body  . Dizziness and weakness   Immunizations Administered    Name Date Dose VIS Date Route   Pfizer COVID-19 Vaccine 01/13/2020  6:12 PM 0.3 mL 10/19/2019 Intramuscular   Manufacturer: [place]   Lot: [id number]   [place]: [id number]

## 2020-02-03 ENCOUNTER — Ambulatory Visit: Payer: 59 | Attending: Internal Medicine

## 2020-02-03 ENCOUNTER — Other Ambulatory Visit: Payer: Self-pay

## 2020-02-03 DIAGNOSIS — Z23 Encounter for immunization: Secondary | ICD-10-CM

## 2020-02-03 NOTE — Progress Notes (Signed)
   Covid-19 Vaccination Clinic  Name:  Chelsea Espinoza    MRN: [id number] DOB: 01-Jan-1961  02/03/2020  Chelsea Espinoza was observed post Covid-19 immunization for 15 minutes without incident. She was provided with Vaccine Information Sheet and instruction to access the V-Safe system.   Chelsea Espinoza was instructed to call 911 with any severe reactions post vaccine: Marland Kitchen Difficulty breathing  . Swelling of face and throat  . A fast heartbeat  . A bad rash all over body  . Dizziness and weakness   Immunizations Administered    Name Date Dose VIS Date Route   Pfizer COVID-19 Vaccine 02/03/2020  4:41 PM 0.3 mL 10/19/2019 Intramuscular   Manufacturer: [place]   Lot: [id number]   [place]: [id number]

## 2020-02-06 IMAGING — MG MM DIGITAL DIAGNOSTIC UNILAT*R* W/ TOMO W/ CAD
4 series · 4 of 12 positions shown · non-contrast
Comparison: Previous exam(s).

CLINICAL DATA: Recall from screening mammogram for a question
asymmetry in the right breast.

EXAM:
DIGITAL DIAGNOSTIC RIGHT MAMMOGRAM WITH CAD AND TOMO
ULTRASOUND RIGHT BREAST

[R ML synth-2D]
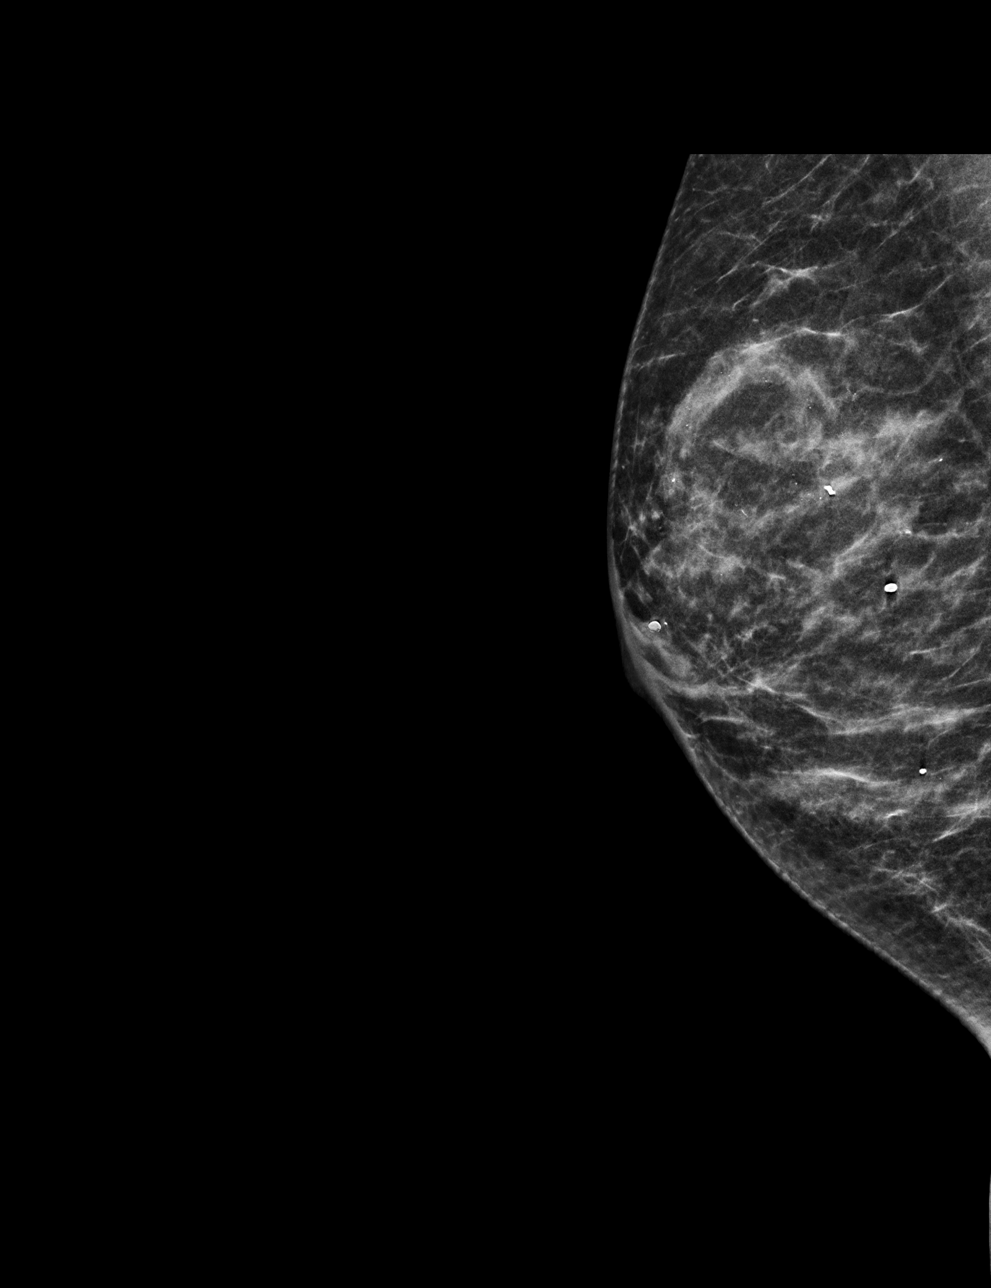

[R CC synth-2D]
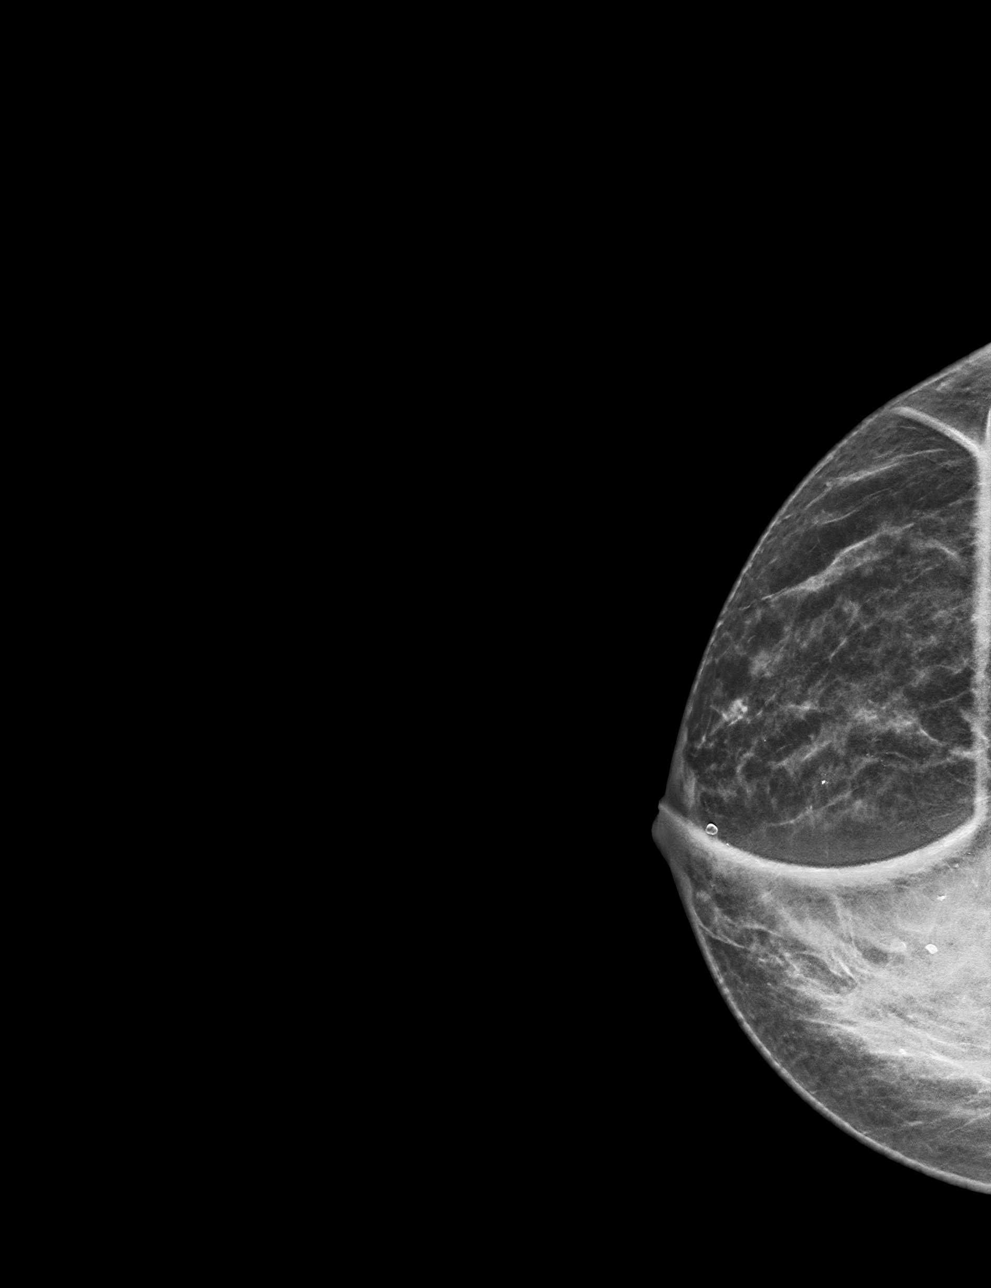

[R CC tomo · tomo slice 23/44.0]
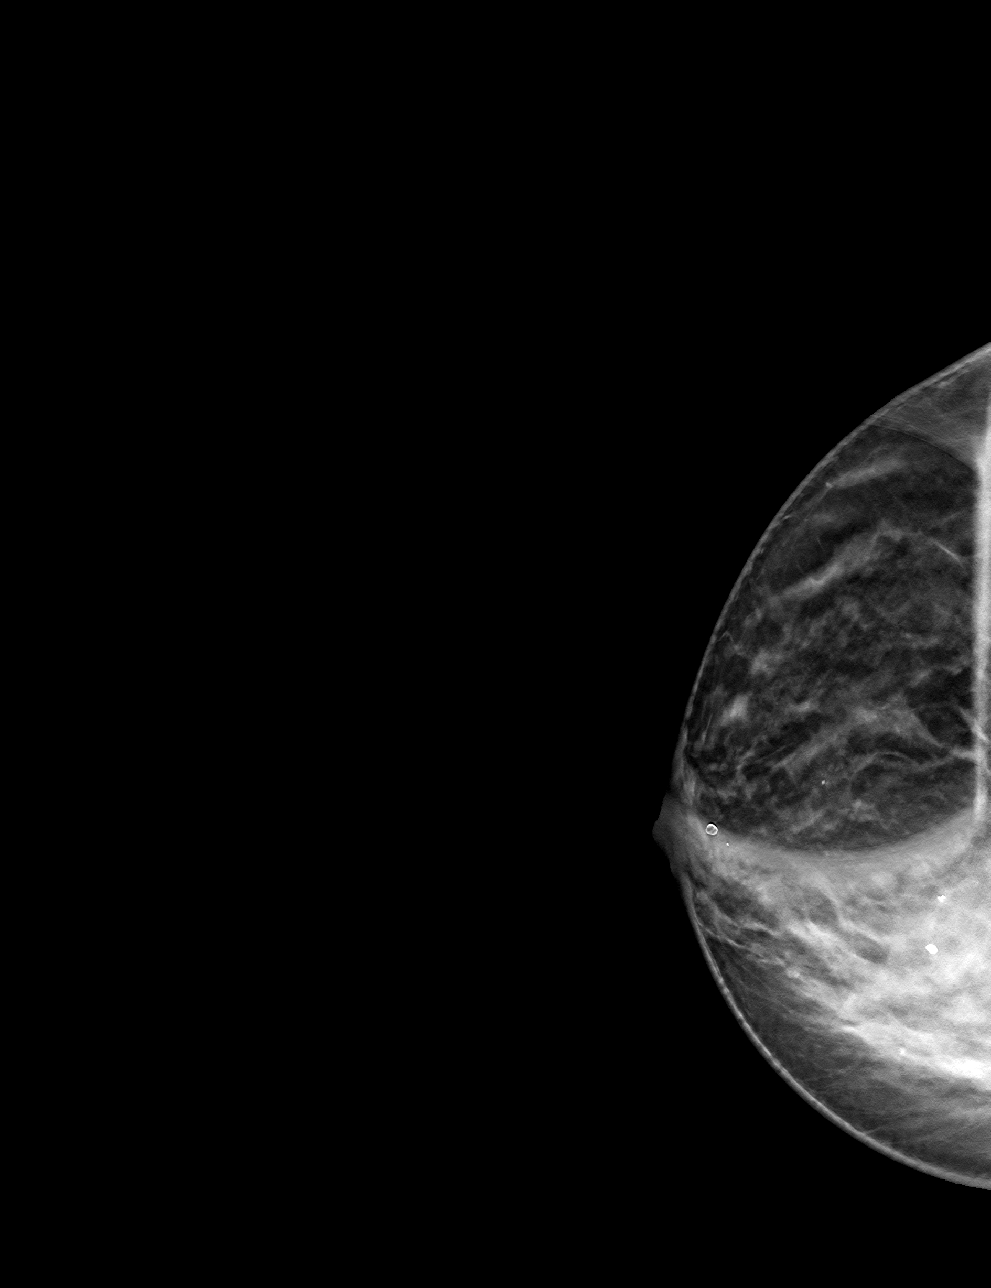

[R ML tomo · tomo slice 22/43.0]
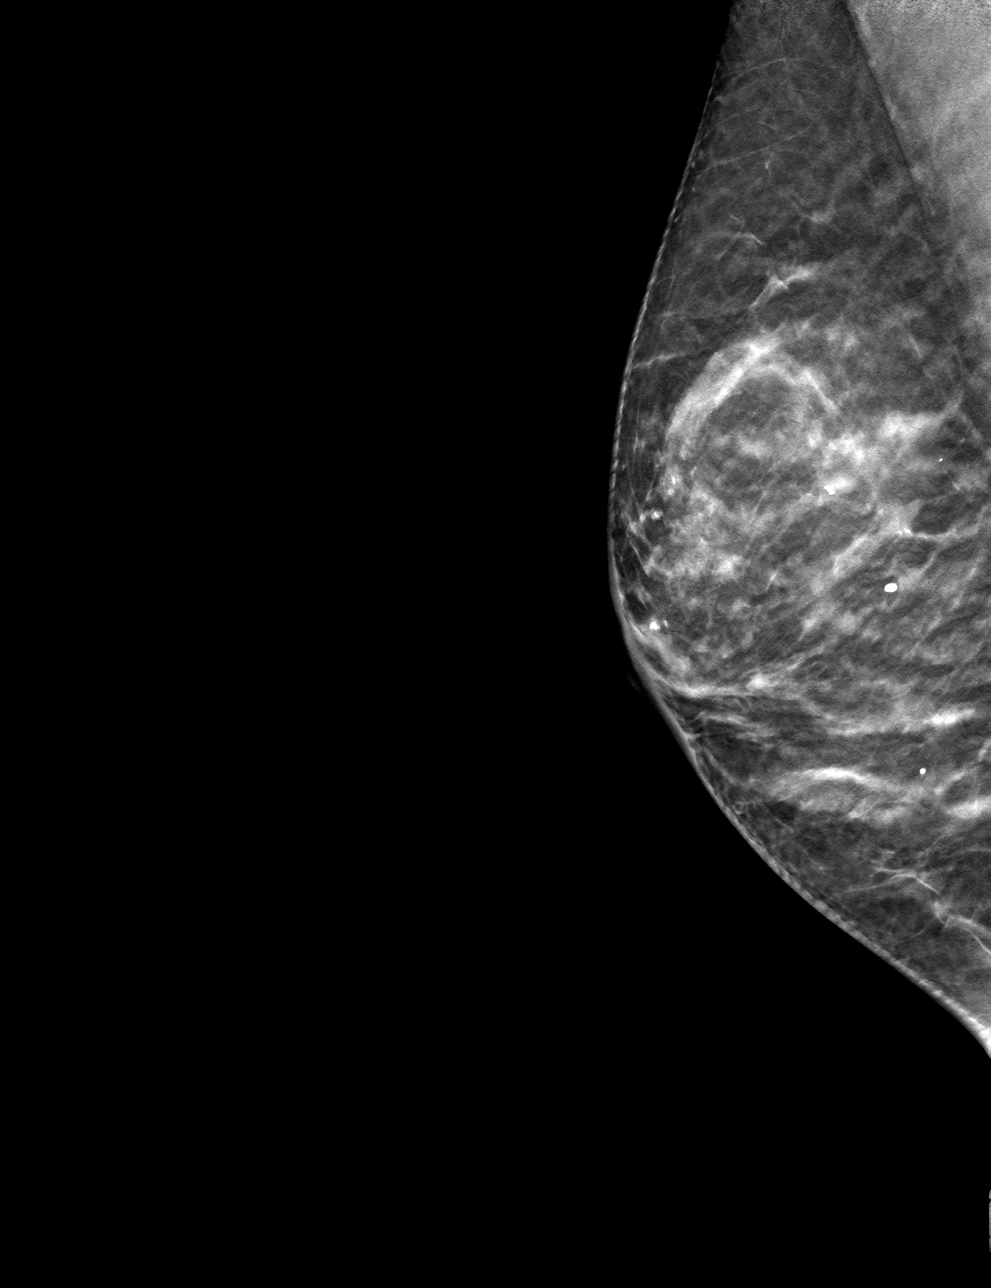

[4 of 12 positions shown; findings below may reference images not displayed]

ACR Breast Density Category c: The breast tissue is heterogeneously
dense, which may obscure small masses.
FINDINGS: The previously described finding does not persist with additional
views, consistent with superimposed fibroglandular tissue. No
suspicious mass, microcalcification, or other finding is identified.
Mammographic images were processed with CAD.

Targeted right breast ultrasound was performed from the 7 o'clock to
the 11 o'clock position in the area of the previously questioned
asymmetry. No suspicious solid or cystic mass is identified.
IMPRESSION: No evidence of malignancy in the right breast.

RECOMMENDATION:
Recommend routine annual screening mammogram in 1 year.

I have discussed the findings and recommendations with the patient.
If applicable, a reminder letter will be sent to the patient
regarding the next appointment.

BI-RADS CATEGORY  1: Negative.

## 2020-02-06 IMAGING — US US BREAST*R* LIMITED INC AXILLA
1 series · 7 of 7 positions shown · non-contrast
Comparison: Previous exam(s).

CLINICAL DATA: Recall from screening mammogram for a question
asymmetry in the right breast.

EXAM:
DIGITAL DIAGNOSTIC RIGHT MAMMOGRAM WITH CAD AND TOMO
ULTRASOUND RIGHT BREAST

[Series 1: us breast*right* limited inc axilla · 0.05mm/px · 7 of 7 slices shown]
[im 1/7]
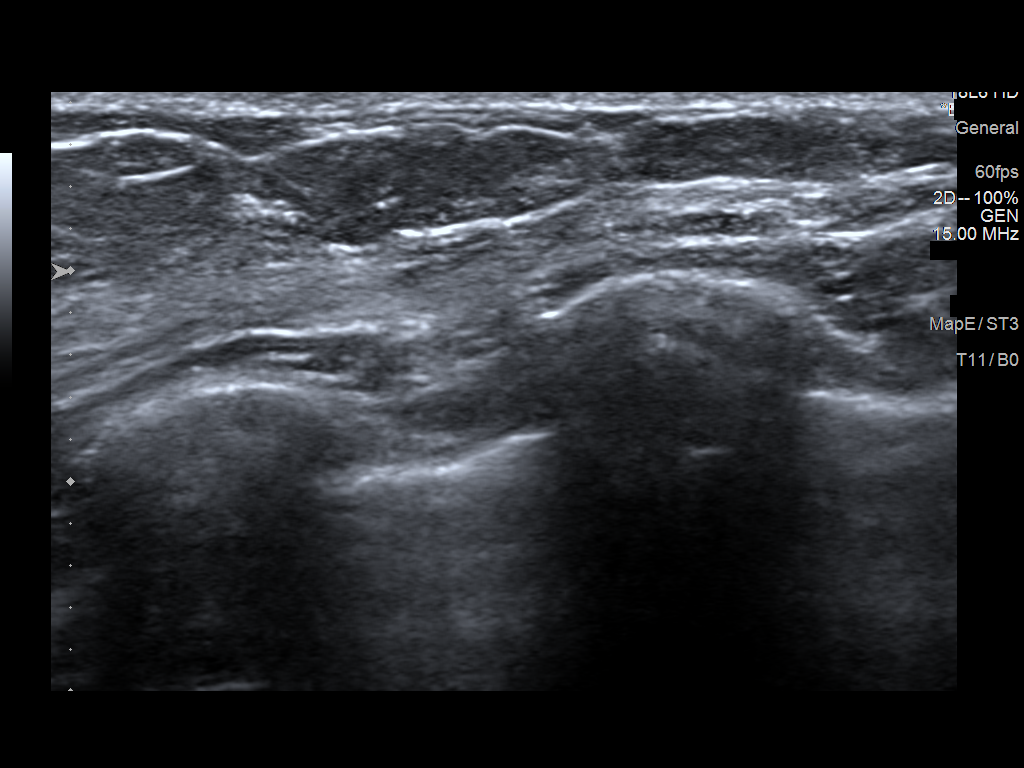
[im 2/7]
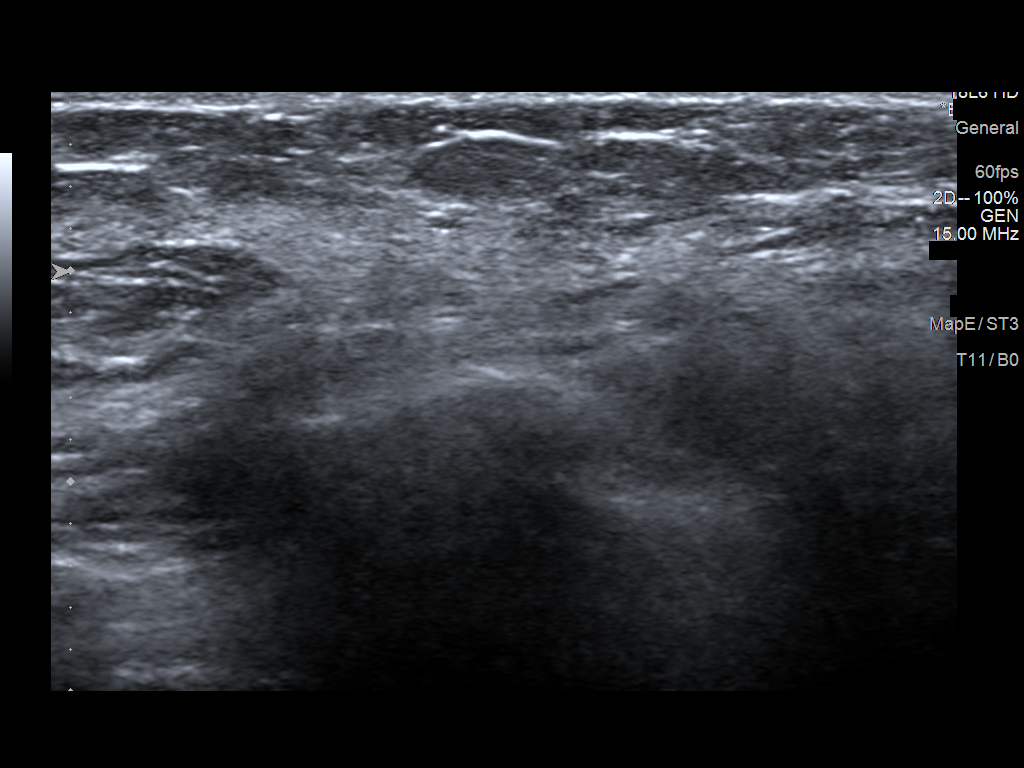
[im 3/7]
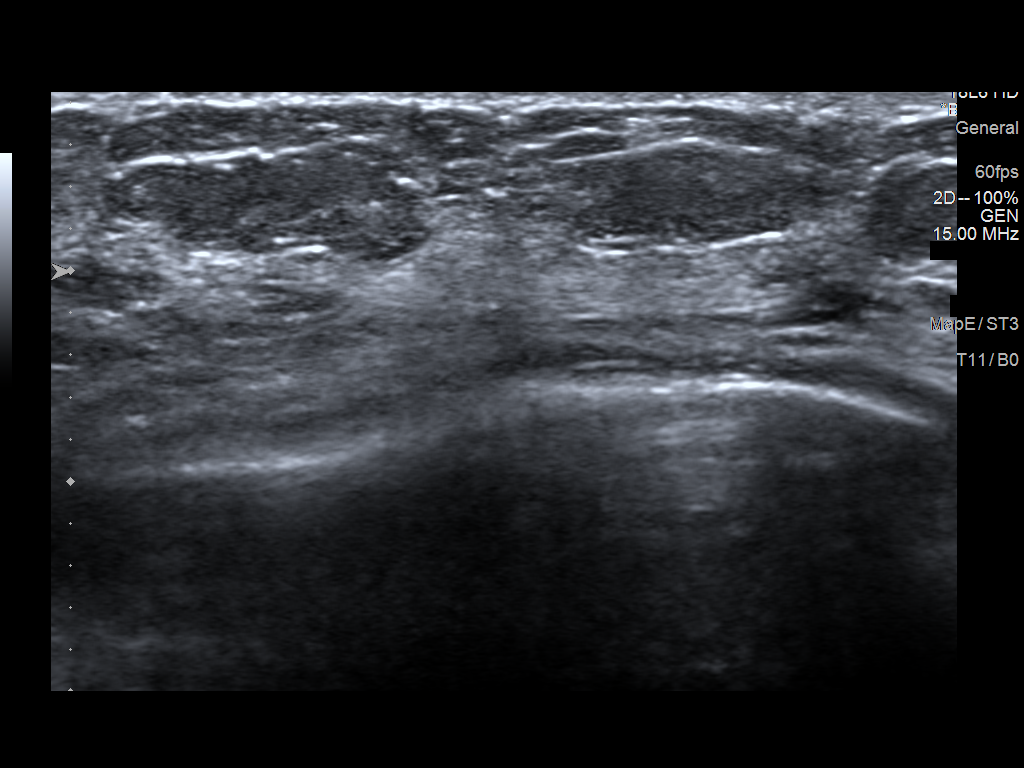
[im 4/7]
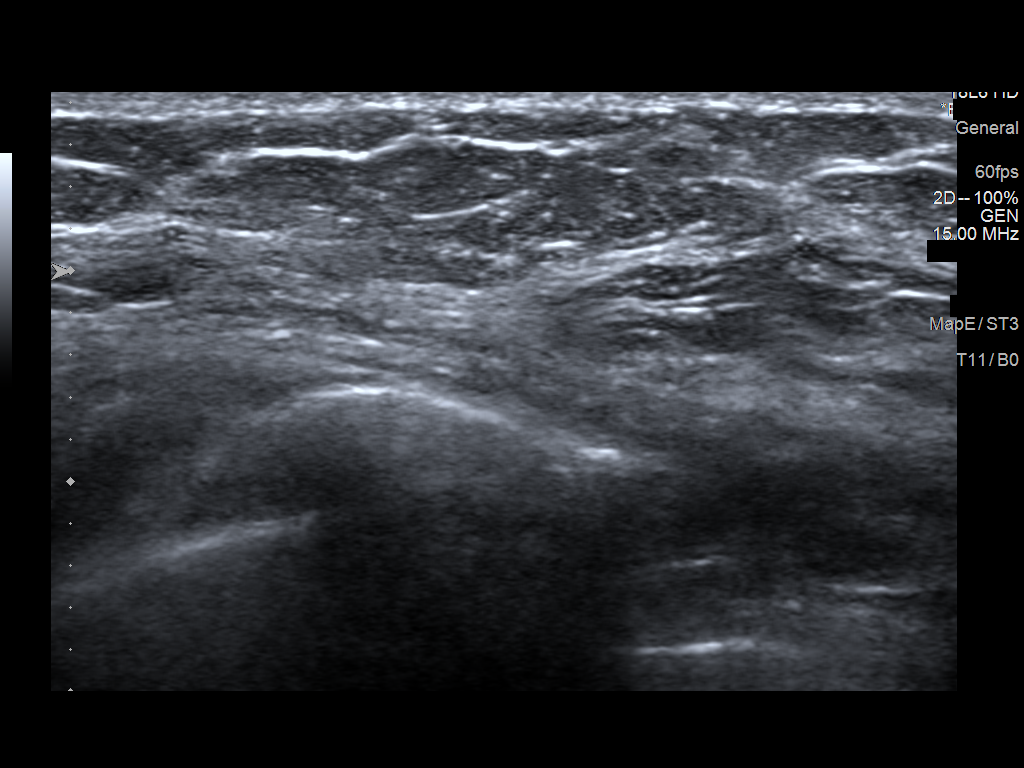
[im 5/7]
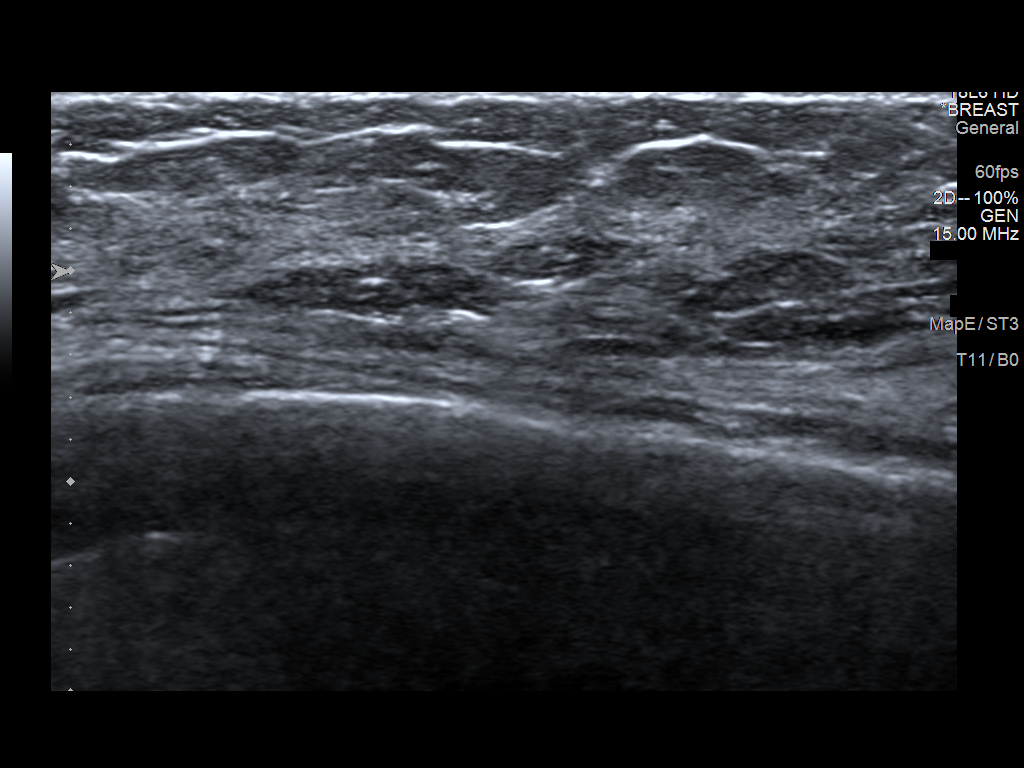
[im 6/7]
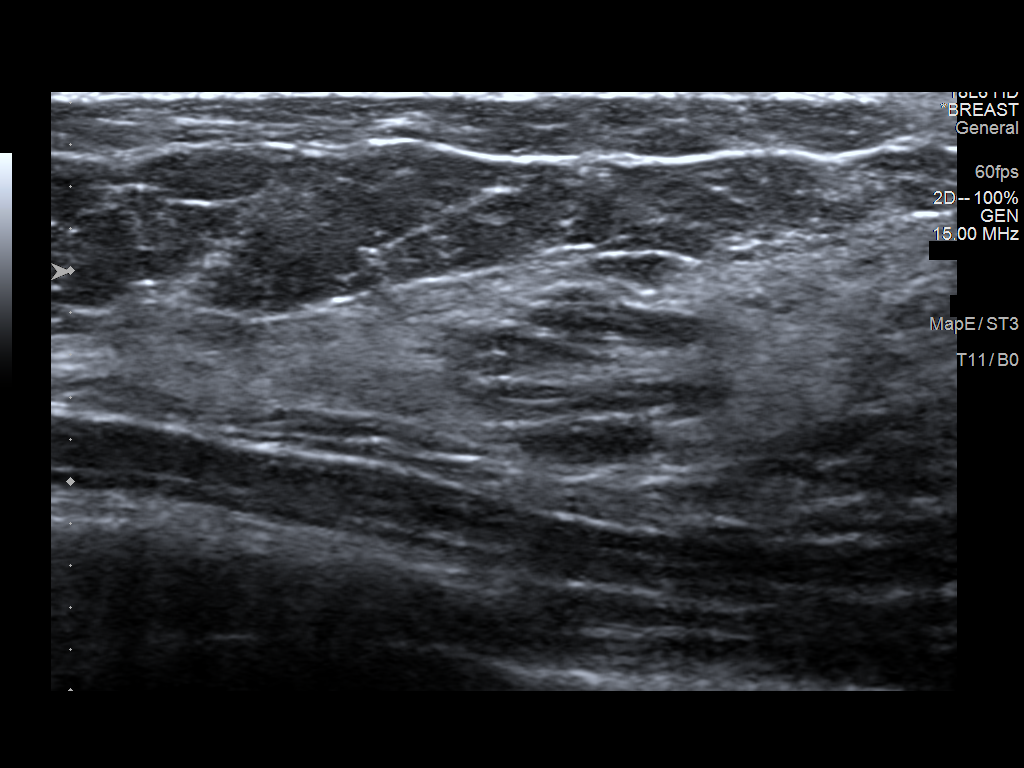
[im 7/7]
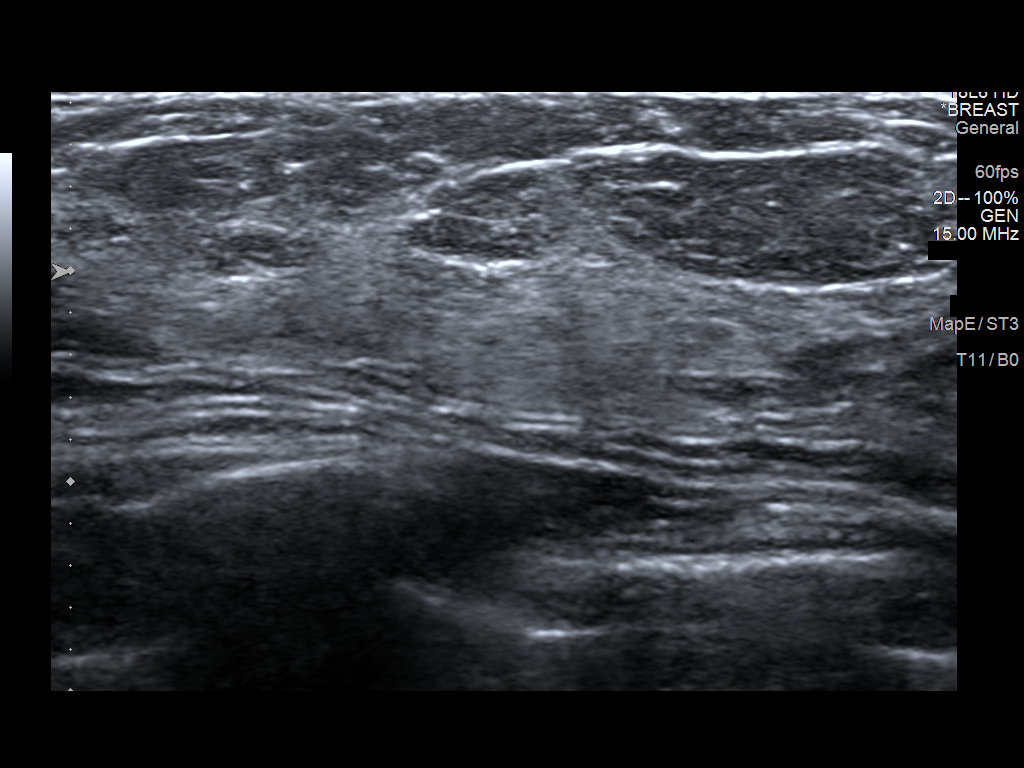

[7 of 7 positions shown; findings below may reference images not displayed]

ACR Breast Density Category c: The breast tissue is heterogeneously
dense, which may obscure small masses.
FINDINGS: The previously described finding does not persist with additional
views, consistent with superimposed fibroglandular tissue. No
suspicious mass, microcalcification, or other finding is identified.
Mammographic images were processed with CAD.

Targeted right breast ultrasound was performed from the 7 o'clock to
the 11 o'clock position in the area of the previously questioned
asymmetry. No suspicious solid or cystic mass is identified.
IMPRESSION: No evidence of malignancy in the right breast.

RECOMMENDATION:
Recommend routine annual screening mammogram in 1 year.

I have discussed the findings and recommendations with the patient.
If applicable, a reminder letter will be sent to the patient
regarding the next appointment.

BI-RADS CATEGORY  1: Negative.

## 2021-10-06 ENCOUNTER — Other Ambulatory Visit: Payer: Self-pay | Admitting: Family Medicine

## 2021-10-06 DIAGNOSIS — R2241 Localized swelling, mass and lump, right lower limb: Secondary | ICD-10-CM

## 2021-12-03 ENCOUNTER — Other Ambulatory Visit: Payer: Self-pay

## 2021-12-03 ENCOUNTER — Other Ambulatory Visit: Payer: Self-pay | Admitting: Family Medicine

## 2021-12-03 ENCOUNTER — Encounter: Payer: Self-pay | Admitting: Internal Medicine

## 2021-12-03 ENCOUNTER — Ambulatory Visit
Admission: RE | Admit: 2021-12-03 | Discharge: 2021-12-03 | Disposition: A | Discharge status: Home / Self Care | Payer: 59 | Source: Ambulatory Visit | Attending: Family Medicine | Admitting: Family Medicine

## 2021-12-03 DIAGNOSIS — R2241 Localized swelling, mass and lump, right lower limb: Secondary | ICD-10-CM

## 2021-12-03 MED ORDER — GADOBENATE DIMEGLUMINE 529 MG/ML IV SOLN
11.0000 mL | Freq: Once | INTRAVENOUS | Status: AC | PRN
  Administered 2021-12-03: 11 mL via INTRAVENOUS

## 2023-01-20 ENCOUNTER — Other Ambulatory Visit: Payer: Self-pay | Admitting: Obstetrics and Gynecology

## 2023-01-20 DIAGNOSIS — R0989 Other specified symptoms and signs involving the circulatory and respiratory systems: Secondary | ICD-10-CM

## 2023-02-08 ENCOUNTER — Encounter: Payer: Self-pay | Admitting: Internal Medicine

## 2023-02-15 ENCOUNTER — Encounter: Payer: Self-pay | Admitting: Internal Medicine

## 2023-02-15 ENCOUNTER — Ambulatory Visit
Admission: RE | Admit: 2023-02-15 | Discharge: 2023-02-15 | Disposition: A | Discharge status: Home / Self Care | Payer: 59 | Source: Ambulatory Visit | Attending: Obstetrics and Gynecology | Admitting: Obstetrics and Gynecology

## 2023-02-15 DIAGNOSIS — R0989 Other specified symptoms and signs involving the circulatory and respiratory systems: Secondary | ICD-10-CM

## 2024-07-12 ENCOUNTER — Other Ambulatory Visit (HOSPITAL_BASED_OUTPATIENT_CLINIC_OR_DEPARTMENT_OTHER): Payer: Self-pay

## 2024-07-12 ENCOUNTER — Encounter: Payer: Self-pay | Admitting: Internal Medicine

## 2024-07-12 MED ORDER — PIOGLITAZONE HCL 15 MG PO TABS
15.0000 mg | ORAL_TABLET | Freq: Every morning | ORAL | 2 refills | Status: AC
  Filled 2024-07-17: qty 90, 90d supply, fill #0

## 2024-07-12 MED ORDER — LOSARTAN POTASSIUM-HCTZ 100-12.5 MG PO TABS
1.0000 | ORAL_TABLET | Freq: Every morning | ORAL | 0 refills | Status: DC
  Filled 2024-07-17: qty 30, 30d supply, fill #0

## 2024-07-12 MED ORDER — ATORVASTATIN CALCIUM 40 MG PO TABS: 40.0000 mg | ORAL_TABLET | Freq: Every day | ORAL | 3 refills | Status: AC

## 2024-07-12 MED ORDER — AMLODIPINE BESYLATE 5 MG PO TABS: 5.0000 mg | ORAL_TABLET | Freq: Every morning | ORAL | 1 refills | Status: AC

## 2024-07-12 MED ORDER — MOUNJARO 2.5 MG/0.5ML ~~LOC~~ SOAJ
2.5000 mg | SUBCUTANEOUS | 5 refills | Status: AC
  Filled 2024-07-23: qty 2, 28d supply, fill #0

## 2024-07-12 MED ORDER — JARDIANCE 25 MG PO TABS: 25.0000 mg | ORAL_TABLET | Freq: Every morning | ORAL | 0 refills | Status: AC

## 2024-07-12 MED ORDER — GLIMEPIRIDE 2 MG PO TABS
2.0000 mg | ORAL_TABLET | Freq: Two times a day (BID) | ORAL | 1 refills | Status: DC
  Filled 2024-07-17: qty 120, 60d supply, fill #0

## 2024-07-17 ENCOUNTER — Other Ambulatory Visit (HOSPITAL_BASED_OUTPATIENT_CLINIC_OR_DEPARTMENT_OTHER): Payer: Self-pay

## 2024-07-18 ENCOUNTER — Other Ambulatory Visit (HOSPITAL_BASED_OUTPATIENT_CLINIC_OR_DEPARTMENT_OTHER): Payer: Self-pay

## 2024-07-18 DIAGNOSIS — H02412 Mechanical ptosis of left eyelid: Secondary | ICD-10-CM | POA: Diagnosis not present

## 2024-07-18 DIAGNOSIS — H02423 Myogenic ptosis of bilateral eyelids: Secondary | ICD-10-CM | POA: Diagnosis not present

## 2024-07-18 DIAGNOSIS — H02411 Mechanical ptosis of right eyelid: Secondary | ICD-10-CM | POA: Diagnosis not present

## 2024-07-18 DIAGNOSIS — H57813 Brow ptosis, bilateral: Secondary | ICD-10-CM | POA: Diagnosis not present

## 2024-07-18 DIAGNOSIS — H02422 Myogenic ptosis of left eyelid: Secondary | ICD-10-CM | POA: Diagnosis not present

## 2024-07-18 DIAGNOSIS — H02421 Myogenic ptosis of right eyelid: Secondary | ICD-10-CM | POA: Diagnosis not present

## 2024-07-18 DIAGNOSIS — H02834 Dermatochalasis of left upper eyelid: Secondary | ICD-10-CM | POA: Diagnosis not present

## 2024-07-18 DIAGNOSIS — Z01818 Encounter for other preprocedural examination: Secondary | ICD-10-CM | POA: Diagnosis not present

## 2024-07-18 DIAGNOSIS — H02831 Dermatochalasis of right upper eyelid: Secondary | ICD-10-CM | POA: Diagnosis not present

## 2024-07-18 DIAGNOSIS — H0279 Other degenerative disorders of eyelid and periocular area: Secondary | ICD-10-CM | POA: Diagnosis not present

## 2024-07-18 DIAGNOSIS — H02413 Mechanical ptosis of bilateral eyelids: Secondary | ICD-10-CM | POA: Diagnosis not present

## 2024-07-18 DIAGNOSIS — H53483 Generalized contraction of visual field, bilateral: Secondary | ICD-10-CM | POA: Diagnosis not present

## 2024-07-23 ENCOUNTER — Encounter: Payer: Self-pay | Admitting: Internal Medicine

## 2024-07-23 ENCOUNTER — Other Ambulatory Visit (HOSPITAL_BASED_OUTPATIENT_CLINIC_OR_DEPARTMENT_OTHER): Payer: Self-pay

## 2024-07-24 ENCOUNTER — Other Ambulatory Visit: Payer: Self-pay

## 2024-07-24 ENCOUNTER — Other Ambulatory Visit (HOSPITAL_BASED_OUTPATIENT_CLINIC_OR_DEPARTMENT_OTHER): Payer: Self-pay

## 2024-07-24 MED ORDER — ATORVASTATIN CALCIUM 40 MG PO TABS
40.0000 mg | ORAL_TABLET | Freq: Every day | ORAL | 1 refills | Status: AC
  Filled 2024-07-24: qty 100, 100d supply, fill #0
  Filled 2024-11-12: qty 100, 100d supply, fill #1

## 2024-07-24 MED ORDER — METFORMIN HCL ER 500 MG PO TB24
500.0000 mg | ORAL_TABLET | Freq: Three times a day (TID) | ORAL | 1 refills | Status: AC
  Filled 2024-07-24: qty 300, 100d supply, fill #0
  Filled 2024-11-04: qty 300, 100d supply, fill #1

## 2024-07-24 MED ORDER — AMLODIPINE BESYLATE 5 MG PO TABS
5.0000 mg | ORAL_TABLET | Freq: Every morning | ORAL | 1 refills | Status: AC
  Filled 2024-07-24: qty 100, 100d supply, fill #0
  Filled 2024-11-12: qty 100, 100d supply, fill #1

## 2024-07-24 MED ORDER — LOSARTAN POTASSIUM-HCTZ 100-12.5 MG PO TABS
1.0000 | ORAL_TABLET | Freq: Every morning | ORAL | 1 refills | Status: AC
  Filled 2024-08-18: qty 60, 60d supply, fill #0
  Filled 2024-10-16 (×2): qty 15, 15d supply, fill #1
  Filled 2024-11-12: qty 100, 100d supply, fill #2

## 2024-07-24 MED ORDER — GLIMEPIRIDE 2 MG PO TABS
2.0000 mg | ORAL_TABLET | Freq: Two times a day (BID) | ORAL | 1 refills | Status: AC
  Filled 2024-09-12: qty 120, 60d supply, fill #0
  Filled 2024-11-12: qty 200, 100d supply, fill #1

## 2024-07-24 MED ORDER — MOUNJARO 2.5 MG/0.5ML ~~LOC~~ SOAJ
2.5000 mg | SUBCUTANEOUS | 1 refills | Status: AC
  Filled 2024-07-24 – 2024-08-18 (×2): qty 6, 84d supply, fill #0
  Filled 2024-11-12: qty 6, 84d supply, fill #1

## 2024-07-24 MED ORDER — JARDIANCE 25 MG PO TABS
25.0000 mg | ORAL_TABLET | Freq: Every morning | ORAL | 1 refills | Status: AC
  Filled 2024-07-24: qty 100, 100d supply, fill #0
  Filled 2024-11-12: qty 100, 100d supply, fill #1

## 2024-07-24 MED ORDER — ESOMEPRAZOLE MAGNESIUM 20 MG PO CPDR
20.0000 mg | DELAYED_RELEASE_CAPSULE | Freq: Every day | ORAL | 1 refills | Status: AC
  Filled 2024-07-24: qty 100, 100d supply, fill #0
  Filled 2024-11-12: qty 100, 100d supply, fill #1

## 2024-07-24 MED ORDER — PIOGLITAZONE HCL 15 MG PO TABS
15.0000 mg | ORAL_TABLET | Freq: Every morning | ORAL | 1 refills | Status: AC
  Filled 2024-10-16: qty 30, 30d supply, fill #0
  Filled 2024-11-12: qty 100, 100d supply, fill #1

## 2024-08-18 ENCOUNTER — Other Ambulatory Visit (HOSPITAL_BASED_OUTPATIENT_CLINIC_OR_DEPARTMENT_OTHER): Payer: Self-pay

## 2024-08-20 ENCOUNTER — Other Ambulatory Visit (HOSPITAL_BASED_OUTPATIENT_CLINIC_OR_DEPARTMENT_OTHER): Payer: Self-pay

## 2024-08-20 MED ORDER — ONETOUCH DELICA PLUS LANCET33G MISC
1 refills | Status: AC
  Filled 2024-08-20: qty 200, 90d supply, fill #0
  Filled 2024-08-21: qty 200, 100d supply, fill #0

## 2024-08-20 MED ORDER — ONETOUCH VERIO VI STRP
ORAL_STRIP | 1 refills | Status: AC
  Filled 2024-08-20: qty 200, 90d supply, fill #0
  Filled 2024-08-21: qty 200, 100d supply, fill #0

## 2024-08-21 ENCOUNTER — Other Ambulatory Visit (HOSPITAL_BASED_OUTPATIENT_CLINIC_OR_DEPARTMENT_OTHER): Payer: Self-pay

## 2024-08-21 ENCOUNTER — Other Ambulatory Visit: Payer: Self-pay

## 2024-08-22 ENCOUNTER — Other Ambulatory Visit: Payer: Self-pay

## 2024-08-22 ENCOUNTER — Other Ambulatory Visit (HOSPITAL_BASED_OUTPATIENT_CLINIC_OR_DEPARTMENT_OTHER): Payer: Self-pay

## 2024-08-23 ENCOUNTER — Other Ambulatory Visit (HOSPITAL_BASED_OUTPATIENT_CLINIC_OR_DEPARTMENT_OTHER): Payer: Self-pay

## 2024-08-28 ENCOUNTER — Other Ambulatory Visit (HOSPITAL_BASED_OUTPATIENT_CLINIC_OR_DEPARTMENT_OTHER): Payer: Self-pay

## 2024-09-12 ENCOUNTER — Other Ambulatory Visit (HOSPITAL_BASED_OUTPATIENT_CLINIC_OR_DEPARTMENT_OTHER): Payer: Self-pay

## 2024-10-16 ENCOUNTER — Other Ambulatory Visit (HOSPITAL_BASED_OUTPATIENT_CLINIC_OR_DEPARTMENT_OTHER): Payer: Self-pay

## 2024-11-12 ENCOUNTER — Other Ambulatory Visit (HOSPITAL_BASED_OUTPATIENT_CLINIC_OR_DEPARTMENT_OTHER): Payer: Self-pay

## 2024-11-12 ENCOUNTER — Other Ambulatory Visit: Payer: Self-pay
